# Patient Record
Sex: Female | Born: 1975
Health system: Southern US, Community
[De-identification: ages and names within clinical notes are randomized; demographics above are authoritative.]

## PROBLEM LIST (undated history)

## (undated) DIAGNOSIS — M545 Low back pain, unspecified: Secondary | ICD-10-CM

## (undated) DIAGNOSIS — D649 Anemia, unspecified: Secondary | ICD-10-CM

## (undated) DIAGNOSIS — F419 Anxiety disorder, unspecified: Secondary | ICD-10-CM

## (undated) DIAGNOSIS — Z8669 Personal history of other diseases of the nervous system and sense organs: Secondary | ICD-10-CM

## (undated) DIAGNOSIS — E063 Autoimmune thyroiditis: Secondary | ICD-10-CM

## (undated) DIAGNOSIS — N390 Urinary tract infection, site not specified: Secondary | ICD-10-CM

## (undated) DIAGNOSIS — R112 Nausea with vomiting, unspecified: Secondary | ICD-10-CM

## (undated) DIAGNOSIS — L710 Perioral dermatitis: Secondary | ICD-10-CM

## (undated) DIAGNOSIS — K219 Gastro-esophageal reflux disease without esophagitis: Secondary | ICD-10-CM

## (undated) HISTORY — PX: TUBAL LIGATION: SHX77

## (undated) HISTORY — DX: Anxiety disorder, unspecified: F41.9

## (undated) HISTORY — PX: CHOLECYSTECTOMY: SHX55

## (undated) HISTORY — DX: Perioral dermatitis: L71.0

## (undated) HISTORY — DX: Low back pain, unspecified: M54.50

## (undated) HISTORY — DX: Personal history of other diseases of the nervous system and sense organs: Z86.69

## (undated) HISTORY — DX: Gastro-esophageal reflux disease without esophagitis: K21.9

## (undated) HISTORY — DX: Autoimmune thyroiditis: E06.3

## (undated) HISTORY — PX: ARTHROSCOPIC REPAIR ACL: SUR80

## (undated) HISTORY — DX: Urinary tract infection, site not specified: N39.0

---
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
## 2012-11-09 ENCOUNTER — Ambulatory Visit: Payer: Self-pay | Admitting: Specialist

## 2016-03-23 ENCOUNTER — Ambulatory Visit
Admission: RE | Admit: 2016-03-23 | Discharge: 2016-03-23 | Disposition: A | Discharge status: Home / Self Care | Payer: BLUE CROSS/BLUE SHIELD | Source: Ambulatory Visit | Attending: Family Medicine | Admitting: Family Medicine

## 2016-03-23 ENCOUNTER — Other Ambulatory Visit: Payer: Self-pay | Admitting: Family Medicine

## 2016-03-23 DIAGNOSIS — R079 Chest pain, unspecified: Secondary | ICD-10-CM

## 2016-03-23 DIAGNOSIS — M438X4 Other specified deforming dorsopathies, thoracic region: Secondary | ICD-10-CM | POA: Insufficient documentation

## 2017-09-14 ENCOUNTER — Other Ambulatory Visit: Payer: Self-pay | Admitting: Family Medicine

## 2017-09-14 DIAGNOSIS — Z1231 Encounter for screening mammogram for malignant neoplasm of breast: Secondary | ICD-10-CM

## 2017-12-26 DIAGNOSIS — J029 Acute pharyngitis, unspecified: Secondary | ICD-10-CM | POA: Diagnosis not present

## 2017-12-26 DIAGNOSIS — R0989 Other specified symptoms and signs involving the circulatory and respiratory systems: Secondary | ICD-10-CM | POA: Diagnosis not present

## 2018-01-15 DIAGNOSIS — K219 Gastro-esophageal reflux disease without esophagitis: Secondary | ICD-10-CM | POA: Diagnosis not present

## 2018-01-15 DIAGNOSIS — D105 Benign neoplasm of other parts of oropharynx: Secondary | ICD-10-CM | POA: Diagnosis not present

## 2018-03-07 ENCOUNTER — Other Ambulatory Visit: Payer: Self-pay | Admitting: Registered Nurse

## 2018-03-07 DIAGNOSIS — Z1231 Encounter for screening mammogram for malignant neoplasm of breast: Secondary | ICD-10-CM

## 2018-03-19 ENCOUNTER — Ambulatory Visit
Admission: RE | Admit: 2018-03-19 | Discharge: 2018-03-19 | Disposition: A | Discharge status: Home / Self Care | Payer: BLUE CROSS/BLUE SHIELD | Source: Ambulatory Visit | Attending: Registered Nurse | Admitting: Registered Nurse

## 2018-03-19 DIAGNOSIS — Z1231 Encounter for screening mammogram for malignant neoplasm of breast: Secondary | ICD-10-CM | POA: Diagnosis not present

## 2018-03-29 ENCOUNTER — Other Ambulatory Visit: Payer: Self-pay | Admitting: Registered Nurse

## 2018-03-29 DIAGNOSIS — Z1231 Encounter for screening mammogram for malignant neoplasm of breast: Secondary | ICD-10-CM

## 2018-04-04 ENCOUNTER — Encounter: Payer: Self-pay | Admitting: Obstetrics and Gynecology

## 2018-04-04 ENCOUNTER — Ambulatory Visit (INDEPENDENT_AMBULATORY_CARE_PROVIDER_SITE_OTHER): Payer: BLUE CROSS/BLUE SHIELD | Admitting: Obstetrics and Gynecology

## 2018-04-04 ENCOUNTER — Other Ambulatory Visit (HOSPITAL_COMMUNITY)
Admission: RE | Admit: 2018-04-04 | Discharge: 2018-04-04 | Disposition: A | Discharge status: Home / Self Care | Payer: BLUE CROSS/BLUE SHIELD | Source: Ambulatory Visit | Attending: Obstetrics and Gynecology | Admitting: Obstetrics and Gynecology

## 2018-04-04 VITALS — BP 110/70 | HR 76 | Ht 66.0 in | Wt 148.0 lb

## 2018-04-04 DIAGNOSIS — Z124 Encounter for screening for malignant neoplasm of cervix: Secondary | ICD-10-CM

## 2018-04-04 DIAGNOSIS — Z1151 Encounter for screening for human papillomavirus (HPV): Secondary | ICD-10-CM | POA: Insufficient documentation

## 2018-04-04 DIAGNOSIS — Z01419 Encounter for gynecological examination (general) (routine) without abnormal findings: Secondary | ICD-10-CM | POA: Diagnosis not present

## 2018-04-04 DIAGNOSIS — Z1239 Encounter for other screening for malignant neoplasm of breast: Secondary | ICD-10-CM

## 2018-04-04 NOTE — Progress Notes (Signed)
PCP:  Patient, No Pcp Per   Chief Complaint  Patient presents with  . Gynecologic Exam     HPI:      [name] is a 42 y.o. [id number] who LMP was Patient's last menstrual period was 03/24/2018 (exact date)., presents today for her NP annual examination.  Her menses are regular every 28-30 days, lasting 5-6 days.  Dysmenorrhea mild, occurring first 1-2 days of flow. She does not have intermenstrual bleeding.  Sex activity: single partner, contraception - tubal ligation and vasectomy.  Last Pap: not recent; no hx of abn paps  Last mammogram: March 19, 2018  Results were: normal--routine follow-up in 12 months There is no FH of breast cancer. There is no FH of ovarian cancer. The patient does do self-breast exams.  Tobacco use: smokes 1 pack per wk; trying to quit; has done chantix 3 times and all the OTC nicotine products. Alcohol use: none No drug use.  Exercise: moderately active  She does get adequate calcium and Vitamin D in her diet. Labs with PCP  Past Medical History:  Diagnosis Date  . GERD (gastroesophageal reflux disease)   . Hashimoto's disease     Past Surgical History:  Procedure Laterality Date  . TUBAL LIGATION      Family History  Problem Relation Age of Onset  . Breast cancer Neg Hx   . Ovarian cancer Neg Hx     Social History   Socioeconomic History  . Marital status: Married    Spouse name: Not on file  . Number of children: Not on file  . Years of education: Not on file  . Highest education level: Not on file  Occupational History  . Not on file  Social Needs  . Financial resource strain: Not on file  . Food insecurity:    Worry: Not on file    Inability: Not on file  . Transportation needs:    Medical: Not on file    Non-medical: Not on file  Tobacco Use  . Smoking status: Current Every Day Smoker  . Smokeless tobacco: Never Used  Substance and Sexual Activity  . Alcohol use: Yes  . Drug use: Never  . Sexual  activity: Yes    Birth control/protection: Surgical    Comment: Tubal ligation/Vasectomy  Lifestyle  . Physical activity:    Days per week: Not on file    Minutes per session: Not on file  . Stress: Not on file  Relationships  . Social connections:    Talks on phone: Not on file    Gets together: Not on file    Attends religious service: Not on file    Active member of club or organization: Not on file    Attends meetings of clubs or organizations: Not on file    Relationship status: Not on file  . Intimate partner violence:    Fear of current or ex partner: Not on file    Emotionally abused: Not on file    Physically abused: Not on file    Forced sexual activity: Not on file  Other Topics Concern  . Not on file  Social History Narrative  . Not on file    Outpatient Medications Prior to Visit  Medication Sig Dispense Refill  . levothyroxine (SYNTHROID) 200 MCG tablet     . omeprazole (PRILOSEC) 40 MG capsule   2   No facility-administered medications prior to visit.      ROS:  Review of Systems  Constitutional: Negative for fatigue, fever and unexpected weight change.  Respiratory: Negative for cough, shortness of breath and wheezing.   Cardiovascular: Negative for chest pain, palpitations and leg swelling.  Gastrointestinal: Negative for blood in stool, constipation, diarrhea, nausea and vomiting.  Endocrine: Negative for cold intolerance, heat intolerance and polyuria.  Genitourinary: Negative for dyspareunia, dysuria, flank pain, frequency, genital sores, hematuria, menstrual problem, pelvic pain, urgency, vaginal bleeding, vaginal discharge and vaginal pain.  Musculoskeletal: Negative for back pain, joint swelling and myalgias.  Skin: Negative for rash.  Neurological: Negative for dizziness, syncope, light-headedness, numbness and headaches.  Hematological: Negative for adenopathy.  Psychiatric/Behavioral: Negative for agitation, confusion, sleep disturbance and  suicidal ideas. The patient is not nervous/anxious.   BREAST: No symptoms   Objective: BP 110/70   Pulse 76   Ht 5\' 6"  (1.676 m)   Wt 148 lb (67.1 kg)   LMP 03/24/2018 (Exact Date)   BMI 23.89 kg/m    Physical Exam  Constitutional: She is oriented to person, place, and time. She appears well-developed and well-nourished.  Genitourinary: Vagina normal and uterus normal. There is no rash or tenderness on the right labia. There is no rash or tenderness on the left labia. No erythema or tenderness in the vagina. No vaginal discharge found. Right adnexum does not display mass and does not display tenderness. Left adnexum does not display mass and does not display tenderness. Cervix does not exhibit motion tenderness or polyp. Uterus is not enlarged or tender.  Neck: Normal range of motion. No thyromegaly present.  Cardiovascular: Normal rate, regular rhythm and normal heart sounds.  No murmur heard. Pulmonary/Chest: Effort normal and breath sounds normal. Right breast exhibits no mass, no nipple discharge, no skin change and no tenderness. Left breast exhibits no mass, no nipple discharge, no skin change and no tenderness.  Abdominal: Soft. There is no tenderness. There is no guarding.  Musculoskeletal: Normal range of motion.  Neurological: She is alert and oriented to person, place, and time. No cranial nerve deficit.  Psychiatric: She has a normal mood and affect. Her behavior is normal.  Vitals reviewed.   Assessment/Plan: Encounter for annual routine gynecological examination  Cervical cancer screening - Plan: Cytology - PAP  Screening for HPV (human papillomavirus) - Plan: Cytology - PAP  Screening for breast cancer - Pt current on mammo           GYN counsel breast self exam, mammography screening, adequate intake of calcium and vitamin D, diet and exercise     F/U  Return in about 1 year (around 04/05/2019).  Felipe Cabell B. Edem Tiegs, PA-C 04/04/2018 10:36 AM

## 2018-04-04 NOTE — Patient Instructions (Signed)
I value your feedback and entrusting us with your care. If you get a [facility] patient survey, I would appreciate you taking the time to let us know about your experience today. Thank you! 

## 2018-04-06 LAB — CYTOLOGY - PAP
Diagnosis: NEGATIVE
HPV (WINDOPATH): NOT DETECTED

## 2018-12-13 DIAGNOSIS — Z3042 Encounter for surveillance of injectable contraceptive: Secondary | ICD-10-CM | POA: Diagnosis not present

## 2019-02-03 ENCOUNTER — Telehealth: Payer: BLUE CROSS/BLUE SHIELD | Admitting: Nurse Practitioner

## 2019-02-03 DIAGNOSIS — L700 Acne vulgaris: Secondary | ICD-10-CM

## 2019-02-03 MED ORDER — MINOCYCLINE HCL 50 MG PO TABS: 50.0000 mg | ORAL_TABLET | Freq: Two times a day (BID) | ORAL | 0 refills | Status: DC

## 2019-02-03 NOTE — Progress Notes (Signed)
We are sorry that you are experiencing this issue.  Here is how we plan to help!  Based on what you shared with me it looks like you have cystic acne.  Acne is a disorder of the hair follicles and oil glands (sebaceous glands). The sebaceous glands secrete oils to keep the skin moist.  When the glands get clogged, it can lead to pimples or cysts.  These cysts may become infected and leave scars. Acne is very common and normally occurs at puberty.  Acne is also inherited.  Your personal care plan consists of the following recommendations:  I recommend that you use a daily cleanser  You might try an over the counter cleanser that has benzoyl peroxide.  I recommend that you start with a product that has 2.5% benzoyl peroxide.  Stronger concentrations have not been shown to be more effective.   I have also prescribed one of the following additional therapies:  Minocycline an oral antibiotic 50 mg twice a day  If excessive dryness or peeling occurs, reduce dose frequency or concentration of the topical scrubs.  If excessive stinging or burning occurs, remove the topical gel with mild soap and water and resume at a lower dose the next day.  Remember oral antibiotics and topical acne treatments may increase your sensitivity to the sun!  HOME CARE:  Do not squeeze pimples because that can often lead to infections, worse acne, and scars.  Use a moisturizer that contains retinoid or fruit acids that may inhibit the development of new acne lesions.  Although there is not a clear link that foods can cause acne, doctors do believe that too many sweets predispose you to skin problems.  GET HELP RIGHT AWAY IF:  If your acne gets worse or is not better within 10 days.  If you become depressed.  If you become pregnant, discontinue medications and call your OB/GYN.  MAKE SURE YOU:  Understand these instructions.  Will watch your condition.  Will get help right away if you are not doing well or  get worse.   Your e-visit answers were reviewed by a board certified advanced clinical practitioner to complete your personal care plan.  Depending upon the condition, your plan could have included both over the counter or prescription medications.  Please review your pharmacy choice.  If there is a problem, you may contact your provider through [employer] and have the prescription routed to another pharmacy.  Your safety is important to [place].  If you have drug allergies check your prescription carefully.  For the next 24 hours you can use MyChart to ask questions about today's visit, request a non-urgent call back, or ask for a work or school excuse from your e-visit provider.  You will get an email in the next two days asking about your experience. I hope that your e-visit has been valuable and will speed your recovery.  5-10 minutes spent reviewing and documenting in chart.

## 2019-02-27 ENCOUNTER — Telehealth: Payer: Self-pay | Admitting: Registered Nurse

## 2019-02-27 ENCOUNTER — Encounter: Payer: Self-pay | Admitting: Registered Nurse

## 2019-02-27 DIAGNOSIS — Z1231 Encounter for screening mammogram for malignant neoplasm of breast: Secondary | ICD-10-CM

## 2019-02-27 NOTE — Telephone Encounter (Signed)
Patient due for screening mammogram 03/21/2019 or later.  ACR Breast Density Category c: The breast tissue is heterogeneously dense, which may obscure small masses  Birads 1 screening mammogram recommended in 1 year on 03/19/2018 mammogram.  Patient contacted via telephone, she has not yet scheduled and needs new order to schedule this year.  Would like to go to same facility [facility]. Notified patient I would enter electronic order for her and she should call to schedule.  Patient verbalized understanding information/instructions, agreed with plan of care and had no further questions at this time.

## 2019-03-07 DIAGNOSIS — Z3042 Encounter for surveillance of injectable contraceptive: Secondary | ICD-10-CM | POA: Diagnosis not present

## 2019-03-13 ENCOUNTER — Other Ambulatory Visit: Payer: Self-pay | Admitting: Registered Nurse

## 2019-03-13 DIAGNOSIS — Z1231 Encounter for screening mammogram for malignant neoplasm of breast: Secondary | ICD-10-CM

## 2019-03-28 ENCOUNTER — Other Ambulatory Visit: Payer: Self-pay

## 2019-03-28 ENCOUNTER — Ambulatory Visit
Admission: RE | Admit: 2019-03-28 | Discharge: 2019-03-28 | Disposition: A | Discharge status: Home / Self Care | Payer: 59 | Source: Ambulatory Visit | Attending: Registered Nurse | Admitting: Registered Nurse

## 2019-03-28 DIAGNOSIS — Z1231 Encounter for screening mammogram for malignant neoplasm of breast: Secondary | ICD-10-CM | POA: Diagnosis not present

## 2019-03-29 ENCOUNTER — Other Ambulatory Visit: Payer: Self-pay | Admitting: Registered Nurse

## 2019-03-29 DIAGNOSIS — N6489 Other specified disorders of breast: Secondary | ICD-10-CM

## 2019-03-29 DIAGNOSIS — R928 Other abnormal and inconclusive findings on diagnostic imaging of breast: Secondary | ICD-10-CM

## 2019-04-02 NOTE — Progress Notes (Signed)
noted 

## 2019-04-10 ENCOUNTER — Ambulatory Visit
Admission: RE | Admit: 2019-04-10 | Discharge: 2019-04-10 | Disposition: A | Discharge status: Home / Self Care | Payer: 59 | Source: Ambulatory Visit | Attending: Registered Nurse | Admitting: Registered Nurse

## 2019-04-10 DIAGNOSIS — R928 Other abnormal and inconclusive findings on diagnostic imaging of breast: Secondary | ICD-10-CM

## 2019-04-10 DIAGNOSIS — N6489 Other specified disorders of breast: Secondary | ICD-10-CM | POA: Diagnosis not present

## 2019-04-10 DIAGNOSIS — N6321 Unspecified lump in the left breast, upper outer quadrant: Secondary | ICD-10-CM | POA: Diagnosis not present

## 2019-04-10 DIAGNOSIS — N6323 Unspecified lump in the left breast, lower outer quadrant: Secondary | ICD-10-CM | POA: Diagnosis not present

## 2019-04-10 DIAGNOSIS — R922 Inconclusive mammogram: Secondary | ICD-10-CM | POA: Diagnosis not present

## 2019-04-11 ENCOUNTER — Telehealth: Payer: Self-pay | Admitting: Registered Nurse

## 2019-04-11 ENCOUNTER — Other Ambulatory Visit: Payer: Self-pay | Admitting: Registered Nurse

## 2019-04-11 ENCOUNTER — Encounter: Payer: Self-pay | Admitting: Registered Nurse

## 2019-04-11 DIAGNOSIS — N6320 Unspecified lump in the left breast, unspecified quadrant: Secondary | ICD-10-CM

## 2019-04-11 NOTE — Telephone Encounter (Signed)
See result note.  Orders placed for 6 month interval follow up recommended by radiologist and discussed with patient today via telephone Birads 3 probable benign mass left breast.

## 2019-04-12 ENCOUNTER — Ambulatory Visit: Payer: 59

## 2019-04-12 ENCOUNTER — Other Ambulatory Visit: Payer: 59

## 2019-05-28 DIAGNOSIS — D225 Melanocytic nevi of trunk: Secondary | ICD-10-CM | POA: Diagnosis not present

## 2019-05-28 DIAGNOSIS — D485 Neoplasm of uncertain behavior of skin: Secondary | ICD-10-CM | POA: Diagnosis not present

## 2019-05-28 DIAGNOSIS — X32XXXA Exposure to sunlight, initial encounter: Secondary | ICD-10-CM | POA: Diagnosis not present

## 2019-05-28 DIAGNOSIS — L570 Actinic keratosis: Secondary | ICD-10-CM | POA: Diagnosis not present

## 2019-09-24 NOTE — Progress Notes (Signed)
Scheduled to complete physical 10/02/19 with Durward Parcel, PA-C.  Repeated EKG changing leads & placement on limbs & still same results.  AMD

## 2019-09-25 ENCOUNTER — Ambulatory Visit: Payer: Self-pay

## 2019-09-25 ENCOUNTER — Other Ambulatory Visit: Payer: Self-pay

## 2019-09-25 DIAGNOSIS — Z0000 Encounter for general adult medical examination without abnormal findings: Secondary | ICD-10-CM

## 2019-09-25 LAB — POCT URINALYSIS DIPSTICK
Bilirubin, UA: NEGATIVE
Glucose, UA: NEGATIVE
Ketones, UA: NEGATIVE
Leukocytes, UA: NEGATIVE
Nitrite, UA: NEGATIVE
Protein, UA: NEGATIVE
Spec Grav, UA: 1.02 (ref 1.010–1.025)
Urobilinogen, UA: 0.2 E.U./dL
pH, UA: 6 (ref 5.0–8.0)

## 2019-09-26 LAB — CMP12+LP+TP+TSH+6AC+CBC/D/PLT
ALT: 8 IU/L (ref 0–32)
AST: 18 IU/L (ref 0–40)
Albumin/Globulin Ratio: 1.7 (ref 1.2–2.2)
Albumin: 4.5 g/dL (ref 3.8–4.8)
Alkaline Phosphatase: 72 IU/L (ref 39–117)
BUN/Creatinine Ratio: 14 (ref 9–23)
BUN: 10 mg/dL (ref 6–24)
Basophils Absolute: 0 10*3/uL (ref 0.0–0.2)
Basos: 1 %
Bilirubin Total: 0.5 mg/dL (ref 0.0–1.2)
Calcium: 9.3 mg/dL (ref 8.7–10.2)
Chloride: 106 mmol/L (ref 96–106)
Chol/HDL Ratio: 2.6 ratio (ref 0.0–4.4)
Cholesterol, Total: 129 mg/dL (ref 100–199)
Creatinine, Ser: 0.71 mg/dL (ref 0.57–1.00)
EOS (ABSOLUTE): 0.2 10*3/uL (ref 0.0–0.4)
Eos: 2 %
Estimated CHD Risk: <0.5 times avg. (ref 0.0–1.0)
Free Thyroxine Index: 4 (ref 1.2–4.9)
GFR calc Af Amer: 119 mL/min/{1.73_m2} (ref >59)
GFR calc non Af Amer: 103 mL/min/{1.73_m2} (ref >59)
GGT: 13 IU/L (ref 0–60)
Globulin, Total: 2.6 g/dL (ref 1.5–4.5)
Glucose: 86 mg/dL (ref 65–99)
HDL: 50 mg/dL (ref >39)
Hematocrit: 37.6 % (ref 34.0–46.6)
Hemoglobin: 12.9 g/dL (ref 11.1–15.9)
Immature Grans (Abs): 0 10*3/uL (ref 0.0–0.1)
Immature Granulocytes: 0 %
Iron: 89 ug/dL (ref 27–159)
LDH: 166 IU/L (ref 119–226)
LDL Chol Calc (NIH): 68 mg/dL (ref 0–99)
Lymphocytes Absolute: 1.2 10*3/uL (ref 0.7–3.1)
Lymphs: 18 %
MCH: 30.6 pg (ref 26.6–33.0)
MCHC: 34.3 g/dL (ref 31.5–35.7)
MCV: 89 fL (ref 79–97)
Monocytes Absolute: 0.4 10*3/uL (ref 0.1–0.9)
Monocytes: 6 %
Neutrophils Absolute: 4.9 10*3/uL (ref 1.4–7.0)
Neutrophils: 73 %
Phosphorus: 3.2 mg/dL (ref 3.0–4.3)
Platelets: 213 10*3/uL (ref 150–450)
Potassium: 4.3 mmol/L (ref 3.5–5.2)
RBC: 4.22 x10E6/uL (ref 3.77–5.28)
RDW: 12.5 % (ref 11.7–15.4)
Sodium: 139 mmol/L (ref 134–144)
T3 Uptake Ratio: 33 % (ref 24–39)
T4, Total: 12.2 ug/dL — ABNORMAL HIGH (ref 4.5–12.0)
TSH: 0.278 u[IU]/mL — ABNORMAL LOW (ref 0.450–4.500)
Total Protein: 7.1 g/dL (ref 6.0–8.5)
Triglycerides: 46 mg/dL (ref 0–149)
Uric Acid: 3.3 mg/dL (ref 2.6–6.2)
VLDL Cholesterol Cal: 11 mg/dL (ref 5–40)
WBC: 6.8 10*3/uL (ref 3.4–10.8)

## 2019-10-02 ENCOUNTER — Ambulatory Visit: Payer: Self-pay | Admitting: Emergency Medicine

## 2019-10-02 ENCOUNTER — Encounter: Payer: Self-pay | Admitting: Emergency Medicine

## 2019-10-02 ENCOUNTER — Other Ambulatory Visit: Payer: Self-pay

## 2019-10-02 VITALS — BP 121/80 | HR 72 | Temp 98.8°F | Resp 12 | Ht 66.0 in | Wt 147.0 lb

## 2019-10-02 DIAGNOSIS — N390 Urinary tract infection, site not specified: Secondary | ICD-10-CM

## 2019-10-02 DIAGNOSIS — Z0000 Encounter for general adult medical examination without abnormal findings: Secondary | ICD-10-CM

## 2019-10-02 NOTE — Progress Notes (Signed)
   Subjective: Annual screening exam    Patient ID: Pamela Cabrera, female    DOB: 07-03-1975, 44 y.o.   MRN: [id number]  HPI Patient presents for follow-up today and to review her lab work that was done previously.  She has no complaints, is requesting refill of Macrobid.   Review of Systems Unremarkable    Objective:   Physical Exam  Gen: Alert, no acute distress Eyes: Pupils equal react light HEENT: Normal Cardiovascular: Regular rate and rhythm, normal S1-S2 Pulmonary: Lungs clear to auscultation bilaterally Abdomen: Soft nontender, no hepatosplenomegaly, normal bowel sounds Extremities: Normal range of motion, no edema Neurological exam: Cranial nerves II through XII grossly intact, normal gait        Assessment & Plan: Annual physical exam  Patient looks well, no acute distress.  She is requesting a refill of Macrobid which I will provide for her.  Her thyroid tests are slightly abnormal, TSH slightly elevated and TSH is low.  We will need to decrease her Synthroid to 88 mcg daily and discontinue the 100 mcg tablet.  Follow-up in 2 months for repeat thyroid testing

## 2019-10-02 NOTE — Progress Notes (Signed)
Labs & EKG completed 09/25/19.  AMD

## 2019-11-06 ENCOUNTER — Other Ambulatory Visit: Payer: Self-pay

## 2019-11-06 DIAGNOSIS — E039 Hypothyroidism, unspecified: Secondary | ICD-10-CM

## 2019-11-06 MED ORDER — LEVOTHYROXINE SODIUM 88 MCG PO TABS: 88.0000 ug | ORAL_TABLET | Freq: Every day | ORAL | 1 refills | Status: DC

## 2019-12-05 ENCOUNTER — Other Ambulatory Visit: Payer: Self-pay

## 2019-12-05 DIAGNOSIS — E039 Hypothyroidism, unspecified: Secondary | ICD-10-CM

## 2019-12-05 NOTE — Progress Notes (Signed)
Follow up requested by Albina Billet

## 2019-12-06 LAB — THYROID PANEL WITH TSH
Free Thyroxine Index: 1.7 (ref 1.2–4.9)
T3 Uptake Ratio: 24 % (ref 24–39)
T4, Total: 7 ug/dL (ref 4.5–12.0)
TSH: 18.8 u[IU]/mL — ABNORMAL HIGH (ref 0.450–4.500)

## 2019-12-12 ENCOUNTER — Other Ambulatory Visit: Payer: Self-pay

## 2019-12-12 DIAGNOSIS — E039 Hypothyroidism, unspecified: Secondary | ICD-10-CM

## 2019-12-13 ENCOUNTER — Telehealth: Payer: Self-pay

## 2019-12-13 LAB — THYROID PANEL WITH TSH
Free Thyroxine Index: 2.5 (ref 1.2–4.9)
T3 Uptake Ratio: 28 % (ref 24–39)
T4, Total: 8.9 ug/dL (ref 4.5–12.0)
TSH: 4.49 u[IU]/mL (ref 0.450–4.500)

## 2019-12-13 LAB — T4, FREE: Free T4: 1.5 ng/dL (ref 0.82–1.77)

## 2019-12-13 NOTE — Telephone Encounter (Signed)
error 

## 2019-12-13 NOTE — Progress Notes (Signed)
am

## 2019-12-13 NOTE — Addendum Note (Signed)
Addended by: Gardner Candle on: 12/13/2019 04:52 PM   Modules accepted: Orders

## 2019-12-18 ENCOUNTER — Other Ambulatory Visit: Payer: Self-pay

## 2019-12-18 DIAGNOSIS — N6320 Unspecified lump in the left breast, unspecified quadrant: Secondary | ICD-10-CM

## 2019-12-18 DIAGNOSIS — R922 Inconclusive mammogram: Secondary | ICD-10-CM | POA: Diagnosis not present

## 2020-01-24 DIAGNOSIS — F411 Generalized anxiety disorder: Secondary | ICD-10-CM | POA: Insufficient documentation

## 2020-01-24 DIAGNOSIS — M545 Low back pain, unspecified: Secondary | ICD-10-CM | POA: Insufficient documentation

## 2020-01-24 DIAGNOSIS — G43909 Migraine, unspecified, not intractable, without status migrainosus: Secondary | ICD-10-CM | POA: Insufficient documentation

## 2020-01-24 DIAGNOSIS — E039 Hypothyroidism, unspecified: Secondary | ICD-10-CM | POA: Insufficient documentation

## 2020-01-24 DIAGNOSIS — L710 Perioral dermatitis: Secondary | ICD-10-CM | POA: Insufficient documentation

## 2020-02-06 DIAGNOSIS — E039 Hypothyroidism, unspecified: Secondary | ICD-10-CM | POA: Diagnosis not present

## 2020-02-19 ENCOUNTER — Other Ambulatory Visit: Payer: Self-pay

## 2020-02-19 DIAGNOSIS — Z1152 Encounter for screening for COVID-19: Secondary | ICD-10-CM

## 2020-02-19 NOTE — Progress Notes (Signed)
Presents to [facility] for outdoor specimen collection for covid testing.  Asymptomatic  Known exposure  Vaccinated  Has Mychart  AMD

## 2020-02-22 LAB — SARS-COV-2, NAA 2 DAY TAT

## 2020-02-22 LAB — NOVEL CORONAVIRUS, NAA: SARS-CoV-2, NAA: NOT DETECTED

## 2020-03-06 ENCOUNTER — Telehealth: Payer: Self-pay

## 2020-03-06 NOTE — Telephone Encounter (Signed)
Marcelino Duster called [facility] requesting Rx for Macrobid.  Checked paper chart in clinic & a copy of the Rx written by Dr. Daryel November on 10/02/19 was in the chart - written as follows: Macrobid 100 mg 1 tab po bid Qty #60  12 (Twelve) Refills  Called [facility] at [phone] & spoke with pharmacist Revonda Standard) who said Marcelino Duster never brought her written Rx into them. Because I have a copy of the Rx, she was able to take the Rx information verbally from me.  AMD

## 2020-03-10 DIAGNOSIS — D2271 Melanocytic nevi of right lower limb, including hip: Secondary | ICD-10-CM | POA: Diagnosis not present

## 2020-03-10 DIAGNOSIS — D2261 Melanocytic nevi of right upper limb, including shoulder: Secondary | ICD-10-CM | POA: Diagnosis not present

## 2020-03-10 DIAGNOSIS — D2262 Melanocytic nevi of left upper limb, including shoulder: Secondary | ICD-10-CM | POA: Diagnosis not present

## 2020-03-10 DIAGNOSIS — D2272 Melanocytic nevi of left lower limb, including hip: Secondary | ICD-10-CM | POA: Diagnosis not present

## 2020-03-10 DIAGNOSIS — D225 Melanocytic nevi of trunk: Secondary | ICD-10-CM | POA: Diagnosis not present

## 2020-05-05 DIAGNOSIS — R635 Abnormal weight gain: Secondary | ICD-10-CM | POA: Diagnosis not present

## 2020-05-05 DIAGNOSIS — E039 Hypothyroidism, unspecified: Secondary | ICD-10-CM | POA: Diagnosis not present

## 2020-05-05 DIAGNOSIS — Z720 Tobacco use: Secondary | ICD-10-CM | POA: Diagnosis not present

## 2020-05-14 DIAGNOSIS — E039 Hypothyroidism, unspecified: Secondary | ICD-10-CM | POA: Diagnosis not present

## 2020-05-21 DIAGNOSIS — E039 Hypothyroidism, unspecified: Secondary | ICD-10-CM | POA: Diagnosis not present

## 2020-08-20 DIAGNOSIS — E039 Hypothyroidism, unspecified: Secondary | ICD-10-CM | POA: Diagnosis not present

## 2020-09-02 DIAGNOSIS — Z1331 Encounter for screening for depression: Secondary | ICD-10-CM | POA: Diagnosis not present

## 2020-09-02 DIAGNOSIS — Z720 Tobacco use: Secondary | ICD-10-CM | POA: Diagnosis not present

## 2020-09-02 DIAGNOSIS — Z0000 Encounter for general adult medical examination without abnormal findings: Secondary | ICD-10-CM | POA: Diagnosis not present

## 2020-09-02 DIAGNOSIS — E039 Hypothyroidism, unspecified: Secondary | ICD-10-CM | POA: Diagnosis not present

## 2020-09-09 DIAGNOSIS — Z0000 Encounter for general adult medical examination without abnormal findings: Secondary | ICD-10-CM | POA: Diagnosis not present

## 2020-09-09 DIAGNOSIS — E039 Hypothyroidism, unspecified: Secondary | ICD-10-CM | POA: Diagnosis not present

## 2020-10-14 DIAGNOSIS — H5213 Myopia, bilateral: Secondary | ICD-10-CM | POA: Diagnosis not present

## 2020-10-14 DIAGNOSIS — Z0100 Encounter for examination of eyes and vision without abnormal findings: Secondary | ICD-10-CM | POA: Diagnosis not present

## 2020-11-24 DIAGNOSIS — E039 Hypothyroidism, unspecified: Secondary | ICD-10-CM | POA: Diagnosis not present

## 2020-12-02 DIAGNOSIS — E039 Hypothyroidism, unspecified: Secondary | ICD-10-CM | POA: Diagnosis not present

## 2021-01-29 ENCOUNTER — Telehealth: Payer: Self-pay | Admitting: Registered Nurse

## 2021-01-29 NOTE — Telephone Encounter (Signed)
Patient overdue 6 month follow up mammogram Birads 3 probably benign and ultrasound  Orders noted by RN Stone Jul 2021 but no results in Epic.    Telephone message left for patient to contact clinic/provider at [phone] verify if follow up imaging completed.  CLINICAL DATA:  Patient was called back from screening mammogram for possible asymmetries in the left breast.   EXAM: DIGITAL DIAGNOSTIC LEFT MAMMOGRAM WITH CAD AND TOMO   ULTRASOUND LEFT BREAST   COMPARISON:  Previous exam(s).   ACR Breast Density Category c: The breast tissue is heterogeneously dense, which may obscure small masses.   FINDINGS: Additional imaging of the left breast was performed. The asymmetries in the medial and lateral aspect of the breast are less conspicuous on the additional imaging. They are is no suspicious mass or malignant type microcalcifications.   Mammographic images were processed with CAD.   On physical exam, I do not palpate a mass in the left breast.   Targeted ultrasound is performed, showing a well-circumscribed hypoechoic mass in the left breast at 3 o'clock 3 cm from the nipple with increased through transmission measuring 6 x 4 x 8 mm. No additional mass is identified in the lateral aspect of the left breast. No suspicious mass, distortion or abnormal shadowing detected in the medial aspect of the left breast.   IMPRESSION: Probable benign asymmetries and probable benign mass in the left breast.   RECOMMENDATION: Short-term interval follow-up left mammogram and ultrasound 6 months is recommended.   I have discussed the findings and recommendations with the patient. If applicable, a reminder letter will be sent to the patient regarding the next appointment.   BI-RADS CATEGORY  3: Probably benign.     Electronically Signed   By: Baird Lyons M.D.   On: 04/10/2019 14:44

## 2021-03-02 DIAGNOSIS — E039 Hypothyroidism, unspecified: Secondary | ICD-10-CM | POA: Diagnosis not present

## 2021-03-12 DIAGNOSIS — D2262 Melanocytic nevi of left upper limb, including shoulder: Secondary | ICD-10-CM | POA: Diagnosis not present

## 2021-03-12 DIAGNOSIS — D225 Melanocytic nevi of trunk: Secondary | ICD-10-CM | POA: Diagnosis not present

## 2021-03-12 DIAGNOSIS — D2272 Melanocytic nevi of left lower limb, including hip: Secondary | ICD-10-CM | POA: Diagnosis not present

## 2021-03-12 DIAGNOSIS — L821 Other seborrheic keratosis: Secondary | ICD-10-CM | POA: Diagnosis not present

## 2021-03-12 DIAGNOSIS — D2271 Melanocytic nevi of right lower limb, including hip: Secondary | ICD-10-CM | POA: Diagnosis not present

## 2021-03-12 DIAGNOSIS — L814 Other melanin hyperpigmentation: Secondary | ICD-10-CM | POA: Diagnosis not present

## 2021-03-12 DIAGNOSIS — X32XXXA Exposure to sunlight, initial encounter: Secondary | ICD-10-CM | POA: Diagnosis not present

## 2021-03-12 DIAGNOSIS — D2261 Melanocytic nevi of right upper limb, including shoulder: Secondary | ICD-10-CM | POA: Diagnosis not present

## 2021-03-16 NOTE — Telephone Encounter (Signed)
Spoke with RN Paschal Dopp today and patient no longer getting PCM care through [facility].  She will need to follow up with her PCM/GYN provider for follow up mammogram order/completion.  Notified RN Paschal Dopp I would contact patient with reminder.  Message reminder due for mammogram follow up.  Will need order from her PCM since no longer utilizing [facility] for primary care.  Contact RN Paschal Dopp or myself at [phone] if further questions.  Notified patient my number does not have voicemail and I will be unavailable tomorrow afternoon due to meetings.

## 2021-06-04 DIAGNOSIS — E039 Hypothyroidism, unspecified: Secondary | ICD-10-CM | POA: Diagnosis not present

## 2021-06-08 DIAGNOSIS — E039 Hypothyroidism, unspecified: Secondary | ICD-10-CM | POA: Diagnosis not present

## 2021-08-23 DIAGNOSIS — L700 Acne vulgaris: Secondary | ICD-10-CM | POA: Diagnosis not present

## 2021-10-01 DIAGNOSIS — Z136 Encounter for screening for cardiovascular disorders: Secondary | ICD-10-CM | POA: Diagnosis not present

## 2021-10-01 DIAGNOSIS — Z0000 Encounter for general adult medical examination without abnormal findings: Secondary | ICD-10-CM | POA: Diagnosis not present

## 2021-10-01 DIAGNOSIS — Z131 Encounter for screening for diabetes mellitus: Secondary | ICD-10-CM | POA: Diagnosis not present

## 2021-10-01 DIAGNOSIS — Z1329 Encounter for screening for other suspected endocrine disorder: Secondary | ICD-10-CM | POA: Diagnosis not present

## 2021-10-01 DIAGNOSIS — E039 Hypothyroidism, unspecified: Secondary | ICD-10-CM | POA: Diagnosis not present

## 2021-10-01 DIAGNOSIS — Z720 Tobacco use: Secondary | ICD-10-CM | POA: Diagnosis not present

## 2021-10-05 DIAGNOSIS — Z0000 Encounter for general adult medical examination without abnormal findings: Secondary | ICD-10-CM | POA: Diagnosis not present

## 2021-10-05 DIAGNOSIS — E039 Hypothyroidism, unspecified: Secondary | ICD-10-CM | POA: Diagnosis not present

## 2021-10-05 DIAGNOSIS — Z1331 Encounter for screening for depression: Secondary | ICD-10-CM | POA: Diagnosis not present

## 2021-10-05 DIAGNOSIS — R635 Abnormal weight gain: Secondary | ICD-10-CM | POA: Diagnosis not present

## 2021-10-18 ENCOUNTER — Other Ambulatory Visit: Payer: Self-pay | Admitting: Internal Medicine

## 2021-10-18 ENCOUNTER — Inpatient Hospital Stay
Admission: RE | Admit: 2021-10-18 | Discharge: 2021-10-18 | Disposition: A | Discharge status: Home / Self Care | Payer: Self-pay | Source: Ambulatory Visit | Attending: *Deleted | Admitting: *Deleted

## 2021-10-18 ENCOUNTER — Other Ambulatory Visit: Payer: Self-pay | Admitting: *Deleted

## 2021-10-18 DIAGNOSIS — Z1231 Encounter for screening mammogram for malignant neoplasm of breast: Secondary | ICD-10-CM

## 2021-10-18 DIAGNOSIS — N6489 Other specified disorders of breast: Secondary | ICD-10-CM

## 2021-10-18 DIAGNOSIS — N63 Unspecified lump in unspecified breast: Secondary | ICD-10-CM

## 2021-11-16 DIAGNOSIS — R58 Hemorrhage, not elsewhere classified: Secondary | ICD-10-CM | POA: Diagnosis not present

## 2021-11-16 DIAGNOSIS — D485 Neoplasm of uncertain behavior of skin: Secondary | ICD-10-CM | POA: Diagnosis not present

## 2021-11-16 DIAGNOSIS — L980 Pyogenic granuloma: Secondary | ICD-10-CM | POA: Diagnosis not present

## 2021-11-17 ENCOUNTER — Ambulatory Visit
Admission: RE | Admit: 2021-11-17 | Discharge: 2021-11-17 | Disposition: A | Discharge status: Home / Self Care | Payer: 59 | Source: Ambulatory Visit | Attending: Internal Medicine | Admitting: Internal Medicine

## 2021-11-17 DIAGNOSIS — Z1231 Encounter for screening mammogram for malignant neoplasm of breast: Secondary | ICD-10-CM | POA: Insufficient documentation

## 2022-02-09 DIAGNOSIS — E039 Hypothyroidism, unspecified: Secondary | ICD-10-CM | POA: Diagnosis not present

## 2022-02-16 DIAGNOSIS — E039 Hypothyroidism, unspecified: Secondary | ICD-10-CM | POA: Diagnosis not present

## 2022-03-14 DIAGNOSIS — D2262 Melanocytic nevi of left upper limb, including shoulder: Secondary | ICD-10-CM | POA: Diagnosis not present

## 2022-03-14 DIAGNOSIS — D2271 Melanocytic nevi of right lower limb, including hip: Secondary | ICD-10-CM | POA: Diagnosis not present

## 2022-03-14 DIAGNOSIS — D225 Melanocytic nevi of trunk: Secondary | ICD-10-CM | POA: Diagnosis not present

## 2022-03-14 DIAGNOSIS — L821 Other seborrheic keratosis: Secondary | ICD-10-CM | POA: Diagnosis not present

## 2022-03-14 DIAGNOSIS — D2272 Melanocytic nevi of left lower limb, including hip: Secondary | ICD-10-CM | POA: Diagnosis not present

## 2022-03-14 DIAGNOSIS — D2261 Melanocytic nevi of right upper limb, including shoulder: Secondary | ICD-10-CM | POA: Diagnosis not present

## 2022-05-12 ENCOUNTER — Emergency Department
Admission: EM | Admit: 2022-05-12 | Discharge: 2022-05-12 | Discharge status: Against Medical Advice | Payer: 59 | Source: Home / Self Care

## 2022-10-10 DIAGNOSIS — R635 Abnormal weight gain: Secondary | ICD-10-CM | POA: Diagnosis not present

## 2022-10-10 DIAGNOSIS — D649 Anemia, unspecified: Secondary | ICD-10-CM | POA: Diagnosis not present

## 2022-10-10 DIAGNOSIS — Z0000 Encounter for general adult medical examination without abnormal findings: Secondary | ICD-10-CM | POA: Diagnosis not present

## 2022-10-10 DIAGNOSIS — E039 Hypothyroidism, unspecified: Secondary | ICD-10-CM | POA: Diagnosis not present

## 2022-10-18 DIAGNOSIS — Z1211 Encounter for screening for malignant neoplasm of colon: Secondary | ICD-10-CM | POA: Diagnosis not present

## 2022-10-18 DIAGNOSIS — E039 Hypothyroidism, unspecified: Secondary | ICD-10-CM | POA: Diagnosis not present

## 2022-10-18 DIAGNOSIS — Z1212 Encounter for screening for malignant neoplasm of rectum: Secondary | ICD-10-CM | POA: Diagnosis not present

## 2022-10-18 DIAGNOSIS — Z1331 Encounter for screening for depression: Secondary | ICD-10-CM | POA: Diagnosis not present

## 2022-10-18 DIAGNOSIS — Z0000 Encounter for general adult medical examination without abnormal findings: Secondary | ICD-10-CM | POA: Diagnosis not present

## 2022-10-18 DIAGNOSIS — D649 Anemia, unspecified: Secondary | ICD-10-CM | POA: Diagnosis not present

## 2022-10-18 DIAGNOSIS — Z6825 Body mass index (BMI) 25.0-25.9, adult: Secondary | ICD-10-CM | POA: Diagnosis not present

## 2022-10-18 DIAGNOSIS — R790 Abnormal level of blood mineral: Secondary | ICD-10-CM | POA: Diagnosis not present

## 2022-10-18 DIAGNOSIS — R7989 Other specified abnormal findings of blood chemistry: Secondary | ICD-10-CM | POA: Diagnosis not present

## 2022-10-19 ENCOUNTER — Other Ambulatory Visit: Payer: Self-pay | Admitting: Internal Medicine

## 2022-10-19 DIAGNOSIS — Z1231 Encounter for screening mammogram for malignant neoplasm of breast: Secondary | ICD-10-CM

## 2022-10-25 DIAGNOSIS — D649 Anemia, unspecified: Secondary | ICD-10-CM | POA: Diagnosis not present

## 2022-10-25 DIAGNOSIS — R790 Abnormal level of blood mineral: Secondary | ICD-10-CM | POA: Diagnosis not present

## 2022-10-26 DIAGNOSIS — Z1212 Encounter for screening for malignant neoplasm of rectum: Secondary | ICD-10-CM | POA: Diagnosis not present

## 2022-10-26 DIAGNOSIS — Z1211 Encounter for screening for malignant neoplasm of colon: Secondary | ICD-10-CM | POA: Diagnosis not present

## 2022-11-04 LAB — COLOGUARD: COLOGUARD: NEGATIVE

## 2022-11-22 ENCOUNTER — Ambulatory Visit
Admission: RE | Admit: 2022-11-22 | Discharge: 2022-11-22 | Disposition: A | Discharge status: Home / Self Care | Payer: 59 | Source: Ambulatory Visit | Attending: Internal Medicine | Admitting: Internal Medicine

## 2022-11-22 DIAGNOSIS — Z1231 Encounter for screening mammogram for malignant neoplasm of breast: Secondary | ICD-10-CM | POA: Diagnosis not present

## 2022-12-06 DIAGNOSIS — H524 Presbyopia: Secondary | ICD-10-CM | POA: Diagnosis not present

## 2022-12-13 ENCOUNTER — Other Ambulatory Visit (HOSPITAL_COMMUNITY)
Admission: RE | Admit: 2022-12-13 | Discharge: 2022-12-13 | Disposition: A | Discharge status: Home / Self Care | Payer: 59 | Source: Ambulatory Visit | Attending: Obstetrics and Gynecology | Admitting: Obstetrics and Gynecology

## 2022-12-13 ENCOUNTER — Ambulatory Visit (INDEPENDENT_AMBULATORY_CARE_PROVIDER_SITE_OTHER): Payer: 59 | Admitting: Obstetrics and Gynecology

## 2022-12-13 ENCOUNTER — Encounter: Payer: Self-pay | Admitting: Obstetrics and Gynecology

## 2022-12-13 VITALS — BP 102/70 | Ht 66.0 in | Wt 158.0 lb

## 2022-12-13 DIAGNOSIS — Z01419 Encounter for gynecological examination (general) (routine) without abnormal findings: Secondary | ICD-10-CM | POA: Diagnosis not present

## 2022-12-13 DIAGNOSIS — Z1151 Encounter for screening for human papillomavirus (HPV): Secondary | ICD-10-CM | POA: Diagnosis not present

## 2022-12-13 DIAGNOSIS — N951 Menopausal and female climacteric states: Secondary | ICD-10-CM

## 2022-12-13 DIAGNOSIS — Z1231 Encounter for screening mammogram for malignant neoplasm of breast: Secondary | ICD-10-CM

## 2022-12-13 DIAGNOSIS — Z124 Encounter for screening for malignant neoplasm of cervix: Secondary | ICD-10-CM | POA: Diagnosis not present

## 2022-12-13 NOTE — Progress Notes (Signed)
PCP:  Barbette Reichmann, MD   Chief Complaint  Patient presents with   Gynecologic Exam    Hot flashes, sweats, acne, irritated x 6 months     HPI:      Pamela Cabrera is a 47 y.o. [phone] who LMP was Patient's last menstrual period was 11/22/2022 (exact date)., presents today for her NP > 3 yrs annual examination.  Her menses are regular every 28-30 days, lasting 5 days, 1 heavy day.  Dysmenorrhea mild, improved with NSAIDs. She does not have intermenstrual bleeding. Has vasomtoor sx that are tolerable, hasn't tried herbal supp.  Hx of hypothyroidism with elevated TSH on 5/24 labs; sees endocrine.   Sex activity: single partner, contraception - tubal ligation and vasectomy. No pain/bleeding/dryness. Last Pap: 04/04/18 Results were no abnormalities/neg HPV DNA; no hx of abn paps  Last mammogram: 11/22/22 Results were: normal--routine follow-up in 12 months There is no FH of breast cancer. There is no FH of ovarian cancer. The patient does do self-breast exams.  Tobacco use: quit 2 yrs ago Alcohol use: none No drug use.  Exercise: moderately active  Colonoscopy: never; neg Cologuard 5/24; repeat due after 3 yrs  She does get adequate calcium and Vitamin D in her diet. Labs with PCP  Past Medical History:  Diagnosis Date   Anxiety    GERD (gastroesophageal reflux disease)    Hashimoto's disease    History of migraine headaches    Lumbar pain    Perioral dermatitis    UTI (urinary tract infection)     Past Surgical History:  Procedure Laterality Date   ARTHROSCOPIC REPAIR ACL Right    CHOLECYSTECTOMY     TUBAL LIGATION      Family History  Problem Relation Age of Onset   Breast cancer Neg Hx    Ovarian cancer Neg Hx     Social History   Socioeconomic History   Marital status: Married    Spouse name: Not on file   Number of children: Not on file   Years of education: Not on file   Highest education level: Not on file  Occupational History   Not  on file  Tobacco Use   Smoking status: Every Day   Smokeless tobacco: Never  Vaping Use   Vaping Use: Never used  Substance and Sexual Activity   Alcohol use: Yes   Drug use: Never   Sexual activity: Yes    Birth control/protection: Surgical    Comment: Tubal ligation/Vasectomy  Other Topics Concern   Not on file  Social History Narrative   Not on file   Social Determinants of Health   Financial Resource Strain: Not on file  Food Insecurity: Not on file  Transportation Needs: Not on file  Physical Activity: Not on file  Stress: Not on file  Social Connections: Not on file  Intimate Partner Violence: Not on file    Outpatient Medications Prior to Visit  Medication Sig Dispense Refill   folic acid (FOLVITE) 1 MG tablet Take by mouth.     iron polysaccharides (NIFEREX) 150 MG capsule Take by mouth.     levothyroxine (SYNTHROID) 100 MCG tablet Take 100 mcg by mouth every morning.     nitrofurantoin, macrocrystal-monohydrate, (MACROBID) 100 MG capsule Take 100 mg by mouth 2 (two) times daily.     triamcinolone cream (KENALOG) 0.5 % Apply topically.     Vitamin D, Ergocalciferol, 50000 units CAPS Take 1 capsule by mouth once a week.  levothyroxine (SYNTHROID) 112 MCG tablet Take by mouth. (Patient not taking: Reported on 12/13/2022)     levothyroxine (SYNTHROID) 88 MCG tablet Take 1 tablet (88 mcg total) by mouth daily. (Patient not taking: Reported on 12/13/2022) 90 tablet 1   minocycline (DYNACIN) 50 MG tablet Take 1 tablet (50 mg total) by mouth 2 (two) times daily. 20 tablet 0   omeprazole (PRILOSEC) 40 MG capsule   2   No facility-administered medications prior to visit.     ROS:  Review of Systems  Constitutional:  Negative for fatigue, fever and unexpected weight change.  Respiratory:  Negative for cough, shortness of breath and wheezing.   Cardiovascular:  Negative for chest pain, palpitations and leg swelling.  Gastrointestinal:  Negative for blood in stool,  constipation, diarrhea, nausea and vomiting.  Endocrine: Negative for cold intolerance, heat intolerance and polyuria.  Genitourinary:  Negative for dyspareunia, dysuria, flank pain, frequency, genital sores, hematuria, menstrual problem, pelvic pain, urgency, vaginal bleeding, vaginal discharge and vaginal pain.  Musculoskeletal:  Negative for back pain, joint swelling and myalgias.  Skin:  Negative for rash.  Neurological:  Negative for dizziness, syncope, light-headedness, numbness and headaches.  Hematological:  Negative for adenopathy.  Psychiatric/Behavioral:  Negative for agitation, confusion, sleep disturbance and suicidal ideas. The patient is not nervous/anxious.   BREAST: No symptoms   Objective: BP 102/70   Ht 5\' 6"  (1.676 m)   Wt 158 lb (71.7 kg)   LMP 11/22/2022 (Exact Date)   BMI 25.50 kg/m    Physical Exam Constitutional:      Appearance: She is well-developed.  Genitourinary:     Vulva normal.     Right Labia: No rash, tenderness or lesions.    Left Labia: No tenderness, lesions or rash.    No vaginal discharge, erythema or tenderness.      Right Adnexa: not tender and no mass present.    Left Adnexa: not tender and no mass present.    No cervical motion tenderness, friability or polyp.     Uterus is not enlarged or tender.  Breasts:    Right: No mass, nipple discharge, skin change or tenderness.     Left: No mass, nipple discharge, skin change or tenderness.  Neck:     Thyroid: No thyromegaly.  Cardiovascular:     Rate and Rhythm: Normal rate and regular rhythm.     Heart sounds: Normal heart sounds. No murmur heard. Pulmonary:     Effort: Pulmonary effort is normal.     Breath sounds: Normal breath sounds.  Abdominal:     Palpations: Abdomen is soft.     Tenderness: There is no abdominal tenderness. There is no guarding or rebound.  Musculoskeletal:        General: Normal range of motion.     Cervical back: Normal range of motion.  Lymphadenopathy:      Cervical: No cervical adenopathy.  Neurological:     General: No focal deficit present.     Mental Status: She is alert and oriented to person, place, and time.     Cranial Nerves: No cranial nerve deficit.  Skin:    General: Skin is warm and dry.  Psychiatric:        Mood and Affect: Mood normal.        Behavior: Behavior normal.        Thought Content: Thought content normal.        Judgment: Judgment normal.  Vitals reviewed.     Assessment/Plan:  Encounter for annual routine gynecological examination  Cervical cancer screening - Plan: Cytology - PAP  Screening for HPV (human papillomavirus) - Plan: Cytology - PAP  Encounter for screening mammogram for malignant neoplasm of breast; pt current on mammo  Perimenopausal vasomotor symptoms--can try estroven as needed. Pt not interested in tx currently. F/u prn/prn AUB.            GYN counsel breast self exam, mammography screening, adequate intake of calcium and vitamin D, diet and exercise     F/U  Return in about 1 year (around 12/13/2023).  Stashia Sia B. Joy Haegele, PA-C 12/13/2022 4:53 PM

## 2022-12-13 NOTE — Patient Instructions (Signed)
I value your feedback and you entrusting us with your care. If you get a [facility] patient survey, I would appreciate you taking the time to let us know about your experience today. Thank you! ? ? ?

## 2022-12-16 ENCOUNTER — Encounter: Payer: Self-pay | Admitting: Obstetrics and Gynecology

## 2022-12-19 LAB — CYTOLOGY - PAP
Comment: NEGATIVE
Diagnosis: NEGATIVE
High risk HPV: NEGATIVE

## 2022-12-21 DIAGNOSIS — H16142 Punctate keratitis, left eye: Secondary | ICD-10-CM | POA: Diagnosis not present

## 2022-12-29 NOTE — Progress Notes (Signed)
noted 

## 2023-01-23 ENCOUNTER — Encounter: Payer: Self-pay | Admitting: Obstetrics and Gynecology

## 2023-01-23 ENCOUNTER — Ambulatory Visit: Payer: 59 | Admitting: Obstetrics and Gynecology

## 2023-01-23 VITALS — BP 112/90 | HR 81 | Resp 16 | Ht 66.0 in | Wt 157.3 lb

## 2023-01-23 DIAGNOSIS — R3129 Other microscopic hematuria: Secondary | ICD-10-CM | POA: Diagnosis not present

## 2023-01-23 DIAGNOSIS — R399 Unspecified symptoms and signs involving the genitourinary system: Secondary | ICD-10-CM

## 2023-01-23 DIAGNOSIS — R3989 Other symptoms and signs involving the genitourinary system: Secondary | ICD-10-CM

## 2023-01-23 LAB — POCT URINALYSIS DIPSTICK
Bilirubin, UA: NEGATIVE
Glucose, UA: NEGATIVE
Ketones, UA: NEGATIVE
Nitrite, UA: NEGATIVE
Protein, UA: NEGATIVE
Spec Grav, UA: 1.015 (ref 1.010–1.025)
pH, UA: 5 (ref 5.0–8.0)

## 2023-01-23 MED ORDER — CEPHALEXIN 500 MG PO CAPS
500.0000 mg | ORAL_CAPSULE | Freq: Two times a day (BID) | ORAL | 0 refills | Status: AC
Start: 2023-01-23 — End: 2023-01-30

## 2023-01-23 NOTE — Progress Notes (Signed)
Barbette Reichmann, MD   Chief Complaint  Patient presents with   Dysuria    HPI:      [name] is a 47 y.o. [id number] whose LMP was Patient's last menstrual period was 01/15/2023., presents today for pelvic/bladder pain for 2 wks. No frequency/urgency/hematuria/dysuria. No LBP, fevers, vag sx, or vaginal bleeding. Pt with hx of recurrent UTI since age 33 (not sexually active). Had eval at [facility] at the time that was negative. Pt put on preventive macrobid. Takes now postcoitally or when feels sx about to start. Usually 1 dose works fine. Sx this time started about 2 wks ago after sex. Has been taking macrobid every day to every other day since without sx change. Drinking lots of water, cut out tea. No hx of kidney stones; no change with cranberry juice/D mannose in past. I can see prior UA in Epic from [facility] but not C&S results. Looks like trace/small hematuria frequently on UA (hard to see if screening or with sx).  She is sexually active, no pain. Menses are still monthly.    Patient Active Problem List   Diagnosis Date Noted   Hypothyroidism 01/24/2020   Lumbar pain 01/24/2020   Migraines 01/24/2020   Perioral dermatitis 01/24/2020   anxiety 01/24/2020   Recurrent UTI 10/04/1985    Past Surgical History:  Procedure Laterality Date   ARTHROSCOPIC REPAIR ACL Right    CHOLECYSTECTOMY     TUBAL LIGATION      Family History  Problem Relation Age of Onset   Breast cancer Neg Hx    Ovarian cancer Neg Hx     Social History   Socioeconomic History   Marital status: Married    Spouse name: Not on file   Number of children: Not on file   Years of education: Not on file   Highest education level: Not on file  Occupational History   Not on file  Tobacco Use   Smoking status: Every Day   Smokeless tobacco: Never  Vaping Use   Vaping status: Never Used  Substance and Sexual Activity   Alcohol use: Yes   Drug use: Never   Sexual activity: Yes     Birth control/protection: Surgical    Comment: Tubal ligation/Vasectomy  Other Topics Concern   Not on file  Social History Narrative   Not on file   Social Determinants of Health   Financial Resource Strain: Not on file  Food Insecurity: Not on file  Transportation Needs: Not on file  Physical Activity: Not on file  Stress: Not on file  Social Connections: Not on file  Intimate Partner Violence: Not on file    Outpatient Medications Prior to Visit  Medication Sig Dispense Refill   folic acid (FOLVITE) 1 MG tablet Take by mouth.     iron polysaccharides (NIFEREX) 150 MG capsule Take by mouth.     levothyroxine (SYNTHROID) 112 MCG tablet Take by mouth.     nitrofurantoin, macrocrystal-monohydrate, (MACROBID) 100 MG capsule Take 100 mg by mouth 2 (two) times daily.     Vitamin D, Ergocalciferol, 50000 units CAPS Take 1 capsule by mouth once a week.     levothyroxine (SYNTHROID) 100 MCG tablet Take 100 mcg by mouth every morning. (Patient not taking: Reported on 01/23/2023)     levothyroxine (SYNTHROID) 88 MCG tablet Take 1 tablet (88 mcg total) by mouth daily. (Patient not taking: Reported on 12/13/2022) 90 tablet 1   triamcinolone cream (KENALOG) 0.5 % Apply  topically. (Patient not taking: Reported on 01/23/2023)     No facility-administered medications prior to visit.      ROS:  Review of Systems  Constitutional:  Negative for fever.  Gastrointestinal:  Negative for blood in stool, constipation, diarrhea, nausea and vomiting.  Genitourinary:  Positive for pelvic pain. Negative for dyspareunia, dysuria, flank pain, frequency, hematuria, urgency, vaginal bleeding, vaginal discharge and vaginal pain.  Musculoskeletal:  Negative for back pain.  Skin:  Negative for rash.   BREAST: No symptoms   OBJECTIVE:   Vitals:  BP (!) 112/90   Pulse 81   Resp 16   Ht 5\' 6"  (1.676 m)   Wt 157 lb 4.8 oz (71.4 kg)   LMP 01/15/2023   BMI 25.39 kg/m   Physical Exam Vitals reviewed.   Constitutional:      Appearance: She is well-developed.  Pulmonary:     Effort: Pulmonary effort is normal.  Musculoskeletal:        General: Normal range of motion.     Cervical back: Normal range of motion.  Skin:    General: Skin is warm and dry.  Neurological:     General: No focal deficit present.     Mental Status: She is alert and oriented to person, place, and time.     Cranial Nerves: No cranial nerve deficit.  Psychiatric:        Mood and Affect: Mood normal.        Behavior: Behavior normal.        Thought Content: Thought content normal.        Judgment: Judgment normal.     Results: Results for orders placed or performed in visit on 01/23/23 (from the past 24 hour(s))  POCT urinalysis dipstick     Status: Abnormal   Collection Time: 01/23/23  2:54 PM  Result Value Ref Range   Color, UA yellow    Clarity, UA cloudy    Glucose, UA Negative Negative   Bilirubin, UA neg    Ketones, UA neg    Spec Grav, UA 1.015 1.010 - 1.025   Blood, UA mod    pH, UA 5.0 5.0 - 8.0   Protein, UA Negative Negative   Urobilinogen, UA     Nitrite, UA neg    Leukocytes, UA Trace (A) Negative   Appearance     Odor       Assessment/Plan: UTI symptoms - Plan: cephALEXin (KEFLEX) 500 MG capsule, Urine Culture, POCT urinalysis dipstick; pos sx and UA. With hematuria. Rx keflex since has sulfa allergy. Will f/u with results. If neg, will eval further.   Meds ordered this encounter  Medications   cephALEXin (KEFLEX) 500 MG capsule    Sig: Take 1 capsule (500 mg total) by mouth 2 (two) times daily for 7 days.    Dispense:  14 capsule    Refill:  0    Order Specific Question:   Supervising Provider    Answer:   Hildred Laser [[id number]]      Return if symptoms worsen or fail to improve.  Krystin Keeven B. Osmani Kersten, PA-C 01/23/2023 2:56 PM

## 2023-01-23 NOTE — Telephone Encounter (Signed)
Can you schedule pt? 

## 2023-01-25 LAB — URINE CULTURE: Organism ID, Bacteria: NO GROWTH

## 2023-01-26 NOTE — Addendum Note (Signed)
Addended by: Rica Records on: 01/26/2023 09:58 AM   Modules accepted: Orders

## 2023-02-01 ENCOUNTER — Ambulatory Visit (INDEPENDENT_AMBULATORY_CARE_PROVIDER_SITE_OTHER): Payer: 59

## 2023-02-01 DIAGNOSIS — R3129 Other microscopic hematuria: Secondary | ICD-10-CM | POA: Diagnosis not present

## 2023-02-01 LAB — POCT URINALYSIS DIPSTICK
Bilirubin, UA: NEGATIVE
Blood, UA: POSITIVE
Glucose, UA: NEGATIVE
Ketones, UA: NEGATIVE
Leukocytes, UA: NEGATIVE
Nitrite, UA: NEGATIVE
Protein, UA: NEGATIVE
Spec Grav, UA: 1.015 (ref 1.010–1.025)
Urobilinogen, UA: 0.2 E.U./dL
pH, UA: 7 (ref 5.0–8.0)

## 2023-02-01 NOTE — Progress Notes (Signed)
    NURSE VISIT NOTE  Subjective:    Patient ID: Tynishia Avara, female    DOB: 03/17/1976, 47 y.o.   MRN: [id number]       HPI  Patient is a 47 y.o. [id number] female who presents for on going pain with urination. Helmut Muster has been following this patient with this issue. Urinalysis is being sent out and waiting for results. Objective:    BP 114/80   Pulse 83   Wt 158 lb (71.7 kg)   LMP 01/15/2023   BMI 25.50 kg/m    Lab Review  Results for orders placed or performed in visit on 02/01/23  POCT Urinalysis Dipstick  Result Value Ref Range   Color, UA     Clarity, UA     Glucose, UA Negative Negative   Bilirubin, UA Negative    Ketones, UA Negative    Spec Grav, UA 1.015 1.010 - 1.025   Blood, UA Positive    pH, UA 7.0 5.0 - 8.0   Protein, UA Negative Negative   Urobilinogen, UA 0.2 0.2 or 1.0 E.U./dL   Nitrite, UA Negative    Leukocytes, UA Negative Negative   Appearance     Odor      Assessment:   1. Other microscopic hematuria      Plan:     Burtis Junes, CMA

## 2023-02-02 LAB — URINALYSIS, ROUTINE W REFLEX MICROSCOPIC
Bilirubin, UA: NEGATIVE
Glucose, UA: NEGATIVE
Ketones, UA: NEGATIVE
Leukocytes,UA: NEGATIVE
Nitrite, UA: NEGATIVE
Protein,UA: NEGATIVE
Specific Gravity, UA: 1.017 (ref 1.005–1.030)
Urobilinogen, Ur: 0.2 mg/dL (ref 0.2–1.0)
pH, UA: 5.5 (ref 5.0–7.5)

## 2023-02-02 LAB — MICROSCOPIC EXAMINATION
Bacteria, UA: NONE SEEN
Casts: NONE SEEN /LPF

## 2023-03-06 ENCOUNTER — Ambulatory Visit (INDEPENDENT_AMBULATORY_CARE_PROVIDER_SITE_OTHER): Payer: 59 | Admitting: Urology

## 2023-03-06 ENCOUNTER — Encounter: Payer: Self-pay | Admitting: Urology

## 2023-03-06 VITALS — BP 127/83 | HR 91 | Ht 66.0 in | Wt 158.0 lb

## 2023-03-06 DIAGNOSIS — R3129 Other microscopic hematuria: Secondary | ICD-10-CM

## 2023-03-06 DIAGNOSIS — R319 Hematuria, unspecified: Secondary | ICD-10-CM

## 2023-03-06 DIAGNOSIS — R102 Pelvic and perineal pain: Secondary | ICD-10-CM | POA: Diagnosis not present

## 2023-03-06 NOTE — Progress Notes (Signed)
I, Maysun Anabel Bene, acting as a scribe for Riki Altes, MD., have documented all relevant documentation on the behalf of Riki Altes, MD, as directed by Riki Altes, MD while in the presence of Riki Altes, MD.  03/06/2023 3:04 PM   Joelly Calton Golds 1975-07-09 [id number]  Referring provider: Rica Records, PA-C [address] [place],  [place] [address]  Chief Complaint  Patient presents with   New Patient (Initial Visit)   Hematuria    HPI: Pamela Cabrera is a 47 y.o. female referred for microhematuria.   Seen by gynecology 01/23/23 with a 2 week history of pelvic pain without lower urinary tract symptoms, dysuria. She had no gross hematuria.  Long history of recurrent UTI since age 39. She had a urologic evaluation at that time which showed no anatomic abnormalities and was put on macrobid prophylaxis. She presently takes post-coitally or when she feels her UTI symptoms are about to occur. She's had several urinalysis which have shown dipstick hematuria, but no significant microscopic hematuria until the follow-up UA 02/01/23 which showed 3-10 RBCs per high-power field.   PMH: Past Medical History:  Diagnosis Date   Anxiety    GERD (gastroesophageal reflux disease)    Hashimoto's disease    History of migraine headaches    Lumbar pain    Perioral dermatitis    UTI (urinary tract infection)     Surgical History: Past Surgical History:  Procedure Laterality Date   ARTHROSCOPIC REPAIR ACL Right    CHOLECYSTECTOMY     TUBAL LIGATION      Home Medications:  Allergies as of 03/06/2023       Reactions   Sulfa Antibiotics Anaphylaxis, Hives, Itching        Medication List        Accurate as of March 06, 2023  3:04 PM. If you have any questions, ask your nurse or doctor.          STOP taking these medications    nitrofurantoin (macrocrystal-monohydrate) 100 MG capsule Commonly known as: MACROBID Stopped by: Riki Altes       TAKE these medications    folic acid 1 MG tablet Commonly known as: FOLVITE Take by mouth.   iron polysaccharides 150 MG capsule Commonly known as: NIFEREX Take by mouth.   levothyroxine 112 MCG tablet Commonly known as: SYNTHROID Take by mouth.   Vitamin D (Ergocalciferol) 50000 units Caps Take 1 capsule by mouth once a week.        Allergies:  Allergies  Allergen Reactions   Sulfa Antibiotics Anaphylaxis, Hives and Itching    Family History: Family History  Problem Relation Age of Onset   Breast cancer Neg Hx    Ovarian cancer Neg Hx     Social History:  reports that she has been smoking. She has never used smokeless tobacco. She reports current alcohol use. She reports that she does not use drugs.   Physical Exam: BP 127/83   Pulse 91   Ht 5\' 6"  (1.676 m)   Wt 158 lb (71.7 kg)   BMI 25.50 kg/m   Constitutional:  Alert and oriented, No acute distress. HEENT: [place] AT, moist mucus membranes.  Trachea midline, no masses. Cardiovascular: No clubbing, cyanosis, or edema. Respiratory: Normal respiratory effort, no increased work of breathing. GI: Abdomen is soft, nontender, nondistended, no abdominal masses Skin: No rashes, bruises or suspicious lesions. Neurologic: Grossly intact, no focal deficits, moving all 4 extremities.  Psychiatric: Normal mood and affect.   Urinalysis 1+ blood on dipstick, microscopy negative.    Assessment & Plan:    1. Microhematuria She has had 1 UA showing clinically significant microhematuria at 3-10 RBCs. We discussed dipstick positive hematuria without >3 RBCs per high-power field is not a reliable indicator of hematuria. Since she does have pelvic pain, recommend further evaluation with a renal stone protocol CT.  If CT negative and pelvic symptoms persist, would recommend cystoscopy under anesthesia with hydrodistention.  [facility] [address] [place], [place]  [address] [phone]

## 2023-03-07 ENCOUNTER — Encounter: Payer: Self-pay | Admitting: Urology

## 2023-03-07 LAB — MICROSCOPIC EXAMINATION: Bacteria, UA: NONE SEEN

## 2023-03-07 LAB — URINALYSIS, COMPLETE
Bilirubin, UA: NEGATIVE
Glucose, UA: NEGATIVE
Ketones, UA: NEGATIVE
Leukocytes,UA: NEGATIVE
Nitrite, UA: NEGATIVE
Protein,UA: NEGATIVE
Specific Gravity, UA: 1.01 (ref 1.005–1.030)
Urobilinogen, Ur: 0.2 mg/dL (ref 0.2–1.0)
pH, UA: 5.5 (ref 5.0–7.5)

## 2023-03-14 ENCOUNTER — Ambulatory Visit
Admission: RE | Admit: 2023-03-14 | Discharge: 2023-03-14 | Disposition: A | Discharge status: Home / Self Care | Payer: 59 | Source: Ambulatory Visit | Attending: Urology | Admitting: Urology

## 2023-03-14 DIAGNOSIS — R319 Hematuria, unspecified: Secondary | ICD-10-CM | POA: Diagnosis not present

## 2023-03-14 DIAGNOSIS — R3129 Other microscopic hematuria: Secondary | ICD-10-CM | POA: Diagnosis not present

## 2023-03-15 IMAGING — MG MM DIGITAL SCREENING BILAT W/ TOMO AND CAD
6 of 10 series · 6 of 30 positions shown · non-contrast
Comparison: Previous exam(s).

CLINICAL DATA: Screening.

EXAM:
DIGITAL SCREENING BILATERAL MAMMOGRAM WITH TOMOSYNTHESIS AND CAD
TECHNIQUE: Bilateral screening digital craniocaudal and mediolateral oblique
mammograms were obtained. Bilateral screening digital breast
tomosynthesis was performed. The images were evaluated with
computer-aided detection.

[R CC synth-2D (1 of 2)]
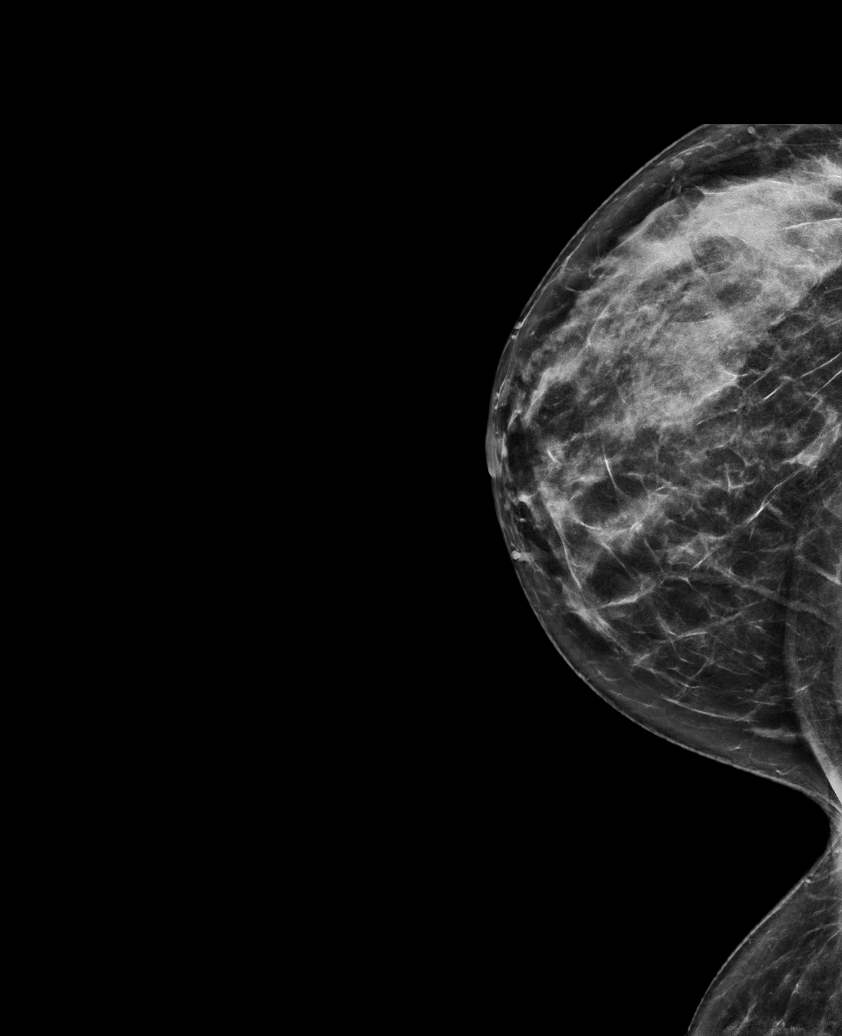

[R MLO synth-2D]
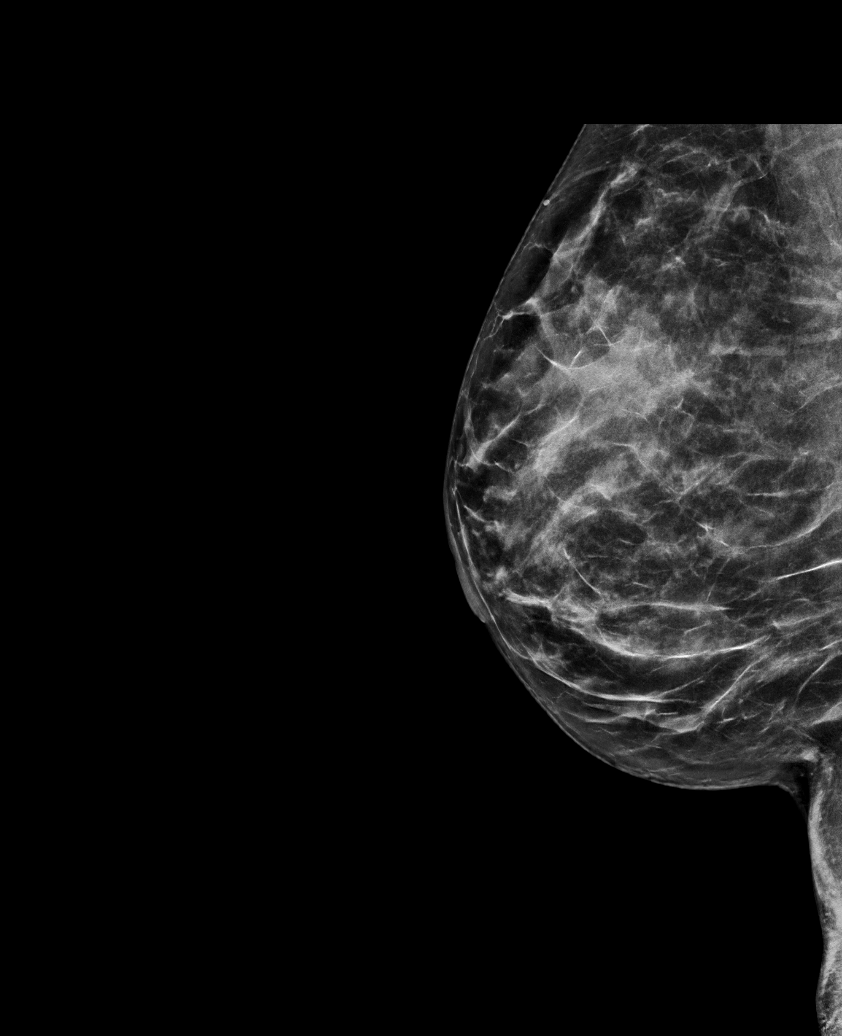

[L MLO synth-2D]
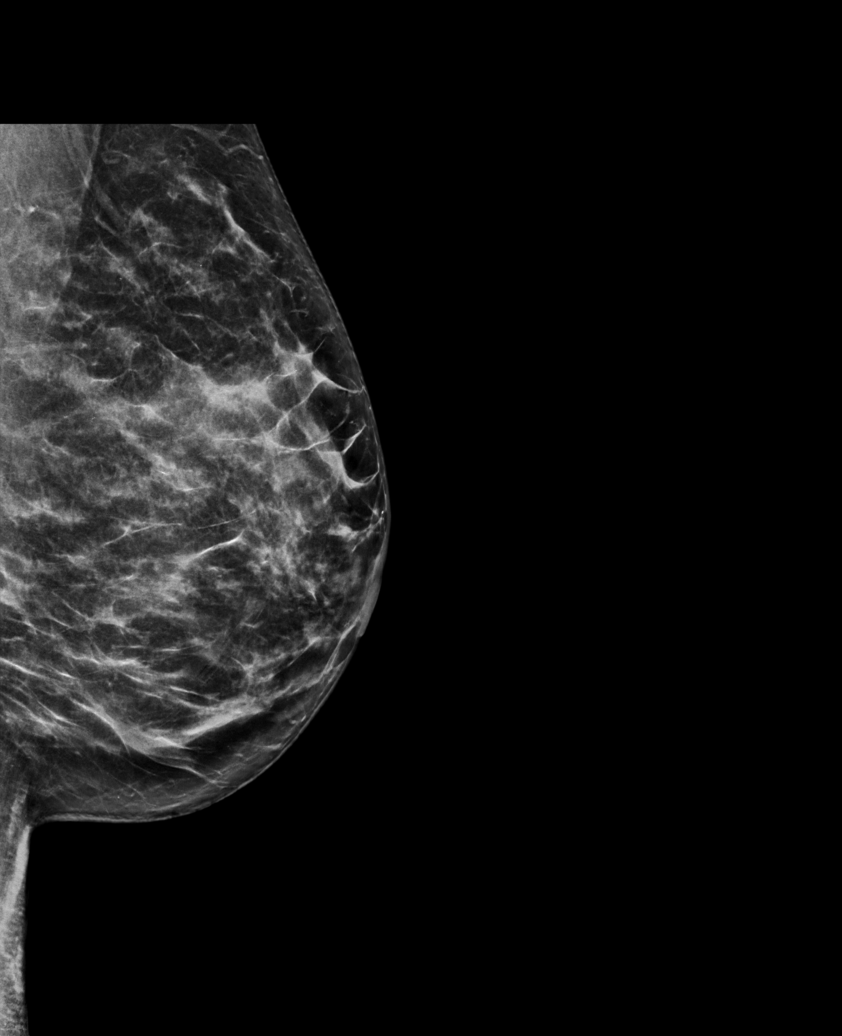

[R CC synth-2D (2 of 2)]
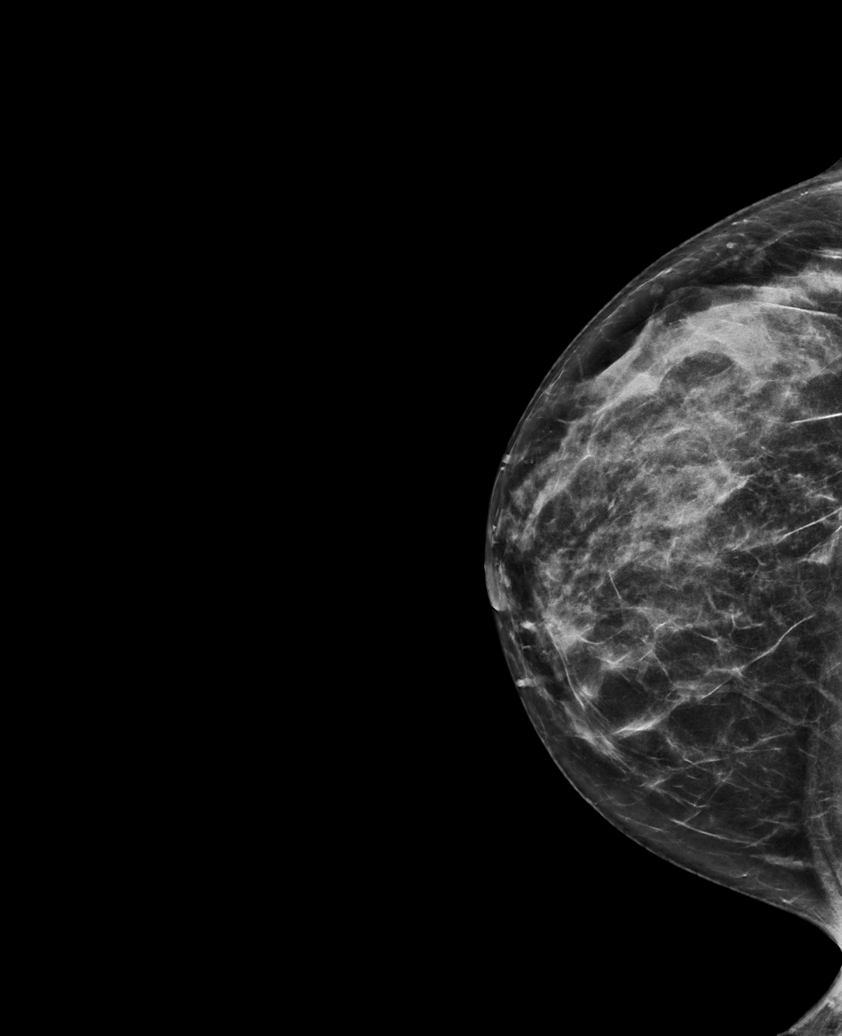

[L CC synth-2D]
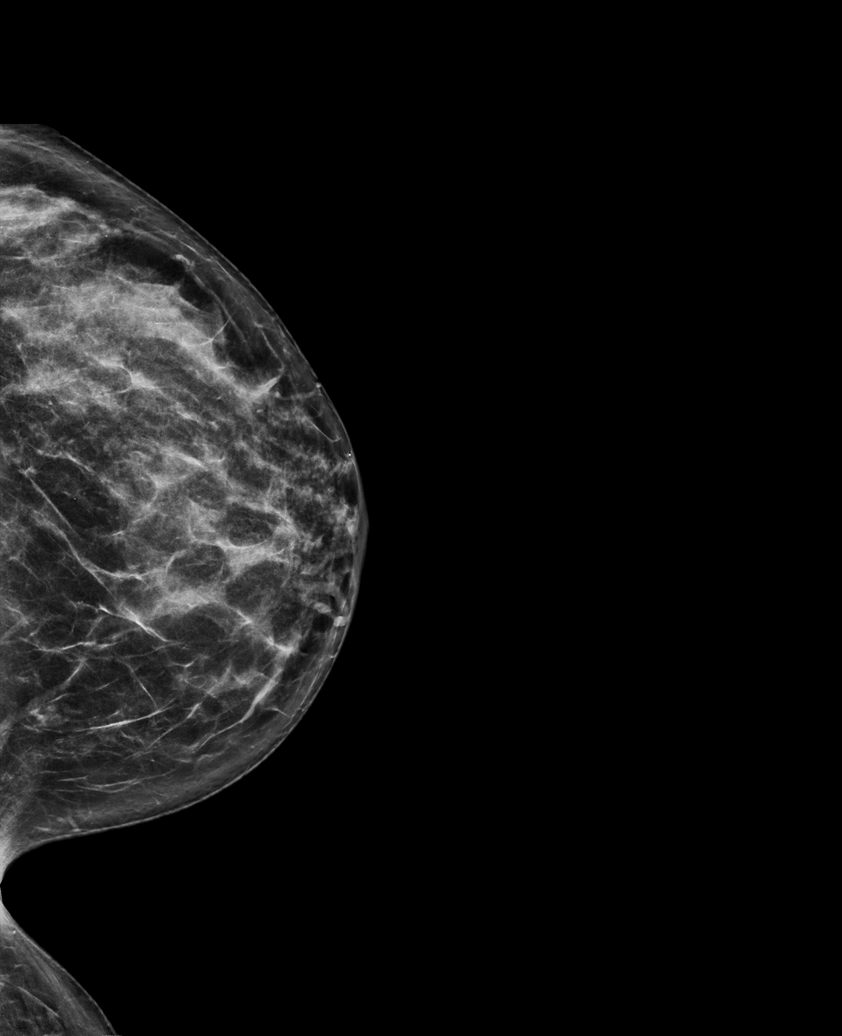

[L CC tomo · tomo slice 36/71.0]
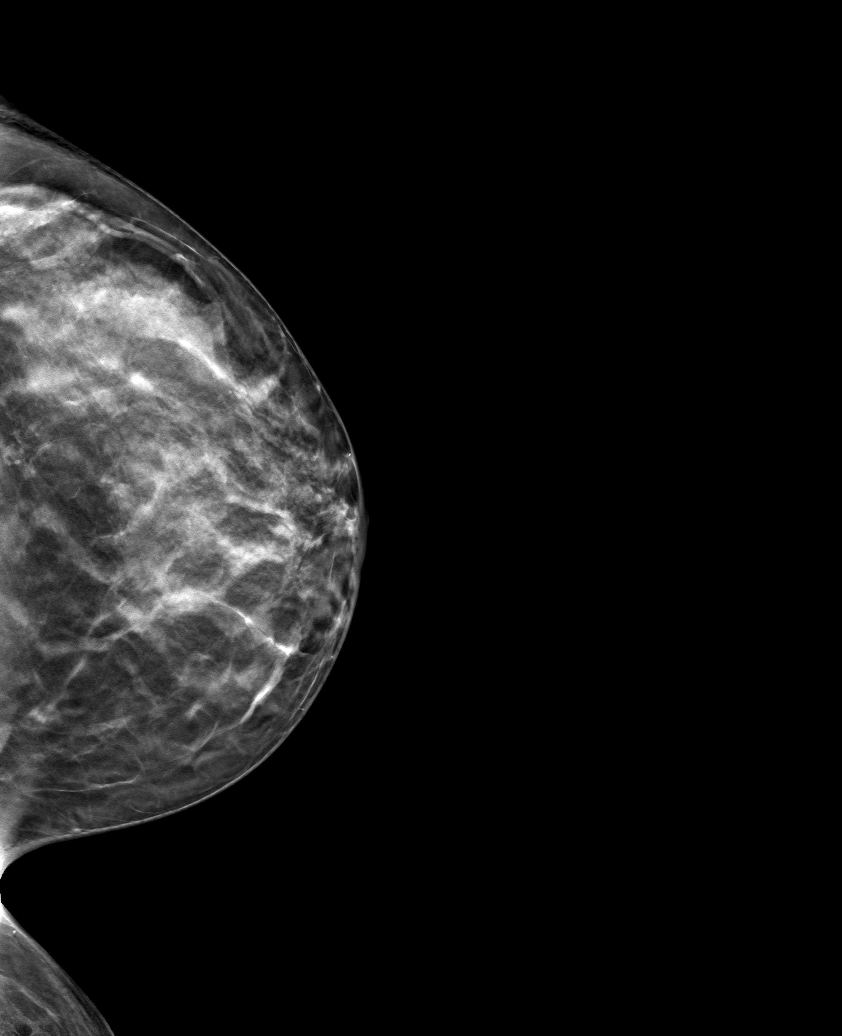

[6 of 30 positions shown; findings below may reference images not displayed]

ACR Breast Density Category c: The breast tissue is heterogeneously
dense, which may obscure small masses.
FINDINGS: There are no findings suspicious for malignancy.
IMPRESSION: No mammographic evidence of malignancy. A result letter of this
screening mammogram will be mailed directly to the patient.

RECOMMENDATION:
Screening mammogram in one year. (Code:Q3-W-BC3)

BI-RADS CATEGORY  1: Negative.

## 2023-03-17 DIAGNOSIS — L821 Other seborrheic keratosis: Secondary | ICD-10-CM | POA: Diagnosis not present

## 2023-03-17 DIAGNOSIS — D225 Melanocytic nevi of trunk: Secondary | ICD-10-CM | POA: Diagnosis not present

## 2023-03-17 DIAGNOSIS — D2262 Melanocytic nevi of left upper limb, including shoulder: Secondary | ICD-10-CM | POA: Diagnosis not present

## 2023-03-17 DIAGNOSIS — D2272 Melanocytic nevi of left lower limb, including hip: Secondary | ICD-10-CM | POA: Diagnosis not present

## 2023-03-17 DIAGNOSIS — D2261 Melanocytic nevi of right upper limb, including shoulder: Secondary | ICD-10-CM | POA: Diagnosis not present

## 2023-03-17 DIAGNOSIS — D2271 Melanocytic nevi of right lower limb, including hip: Secondary | ICD-10-CM | POA: Diagnosis not present

## 2023-04-20 DIAGNOSIS — E559 Vitamin D deficiency, unspecified: Secondary | ICD-10-CM | POA: Diagnosis not present

## 2023-04-20 DIAGNOSIS — E039 Hypothyroidism, unspecified: Secondary | ICD-10-CM | POA: Diagnosis not present

## 2023-08-28 DIAGNOSIS — E039 Hypothyroidism, unspecified: Secondary | ICD-10-CM | POA: Diagnosis not present

## 2023-08-30 DIAGNOSIS — E039 Hypothyroidism, unspecified: Secondary | ICD-10-CM | POA: Diagnosis not present

## 2023-10-25 DIAGNOSIS — D649 Anemia, unspecified: Secondary | ICD-10-CM | POA: Diagnosis not present

## 2023-10-25 DIAGNOSIS — R790 Abnormal level of blood mineral: Secondary | ICD-10-CM | POA: Diagnosis not present

## 2023-10-25 DIAGNOSIS — Z0000 Encounter for general adult medical examination without abnormal findings: Secondary | ICD-10-CM | POA: Diagnosis not present

## 2023-10-25 DIAGNOSIS — R8290 Unspecified abnormal findings in urine: Secondary | ICD-10-CM | POA: Diagnosis not present

## 2023-10-25 DIAGNOSIS — R7989 Other specified abnormal findings of blood chemistry: Secondary | ICD-10-CM | POA: Diagnosis not present

## 2023-10-25 DIAGNOSIS — E039 Hypothyroidism, unspecified: Secondary | ICD-10-CM | POA: Diagnosis not present

## 2023-11-01 DIAGNOSIS — Z1331 Encounter for screening for depression: Secondary | ICD-10-CM | POA: Diagnosis not present

## 2023-11-01 DIAGNOSIS — Z0000 Encounter for general adult medical examination without abnormal findings: Secondary | ICD-10-CM | POA: Diagnosis not present

## 2023-11-01 DIAGNOSIS — R319 Hematuria, unspecified: Secondary | ICD-10-CM | POA: Diagnosis not present

## 2023-11-01 DIAGNOSIS — E039 Hypothyroidism, unspecified: Secondary | ICD-10-CM | POA: Diagnosis not present

## 2023-11-01 DIAGNOSIS — D509 Iron deficiency anemia, unspecified: Secondary | ICD-10-CM | POA: Diagnosis not present

## 2023-11-01 DIAGNOSIS — K047 Periapical abscess without sinus: Secondary | ICD-10-CM | POA: Diagnosis not present

## 2023-11-01 DIAGNOSIS — R635 Abnormal weight gain: Secondary | ICD-10-CM | POA: Diagnosis not present

## 2023-11-01 DIAGNOSIS — Z6826 Body mass index (BMI) 26.0-26.9, adult: Secondary | ICD-10-CM | POA: Diagnosis not present

## 2023-11-05 ENCOUNTER — Encounter: Payer: Self-pay | Admitting: Urology

## 2023-11-08 ENCOUNTER — Encounter: Payer: Self-pay | Admitting: Internal Medicine

## 2023-11-08 ENCOUNTER — Inpatient Hospital Stay: Attending: Internal Medicine | Admitting: Internal Medicine

## 2023-11-08 ENCOUNTER — Inpatient Hospital Stay

## 2023-11-08 VITALS — BP 138/78 | HR 78 | Temp 98.7°F | Resp 18 | Wt 160.0 lb

## 2023-11-08 DIAGNOSIS — K921 Melena: Secondary | ICD-10-CM | POA: Diagnosis not present

## 2023-11-08 DIAGNOSIS — R319 Hematuria, unspecified: Secondary | ICD-10-CM | POA: Diagnosis not present

## 2023-11-08 DIAGNOSIS — Z87440 Personal history of urinary (tract) infections: Secondary | ICD-10-CM | POA: Diagnosis not present

## 2023-11-08 DIAGNOSIS — R42 Dizziness and giddiness: Secondary | ICD-10-CM | POA: Insufficient documentation

## 2023-11-08 DIAGNOSIS — N920 Excessive and frequent menstruation with regular cycle: Secondary | ICD-10-CM | POA: Diagnosis not present

## 2023-11-08 DIAGNOSIS — Z87891 Personal history of nicotine dependence: Secondary | ICD-10-CM | POA: Insufficient documentation

## 2023-11-08 DIAGNOSIS — R5383 Other fatigue: Secondary | ICD-10-CM | POA: Diagnosis not present

## 2023-11-08 DIAGNOSIS — R519 Headache, unspecified: Secondary | ICD-10-CM | POA: Insufficient documentation

## 2023-11-08 DIAGNOSIS — D649 Anemia, unspecified: Secondary | ICD-10-CM

## 2023-11-08 NOTE — Progress Notes (Signed)
 Patient has been having issues with her iron, the iron pills cause stomach issues, she has never had the iron infusions before. Fatigue, and she has been having kidney issues, she has been on Macrobid since she was about 48 years old. She has a urologist appointment  Bladder pain hasn't gone away, which is pretty consistent, she rates her pain at about  a 3. she had the US  done at Dr. Mindi Alto office back in 03/14/2023, she never did hear anything from them, which she had some family issues, so she is here today to see about getting her Iron at a normal level, or at least close to it.

## 2023-11-08 NOTE — Assessment & Plan Note (Addendum)
#   MAY 2025- [PCP] Anemia- Hb 10-; ferritin 5-symptomatic.  Likely due to iron deficiency - from etiology GI blood loss/menorrhagia/malabsorption.  Poor tolerance/lack of improvement on oral iron.    # Discussed regarding IV iron infusion/Venofer. Discussed the potential acute infusion reactions with IV iron; which are quite rare.  Patient understands the risk; will proceed with infusions.   #Etiology of iron deficiency: Urinary/menstrual most likely blood loss.  UA- positive [awaiting evaluation with Dr. Stoioff/cystoscopy]-intermittent blood in stools likely hemorrhoidal -Cologuard 6 months ago negative.  Consider screening colonoscopy.  Await above work up.   Thank you Dr. Kevan Peers for allowing me to participate in the care of your pleasant patient. Please do not hesitate to contact me with questions or concerns in the interim.  Vagal response-needs supine; NO urine pregnancy test [tubal ligation]  # DISPOSITION: # NO labs today # weekly venofer x 4  - next week. # follow up in 2 months-  MD; labs- cbc/bmp;LDH; haptoglobin;  possible venofer- Dr.B  All questions were answered. The patient knows to call the clinic with any problems, questions or concerns.

## 2023-11-08 NOTE — Progress Notes (Signed)
 [facility] CONSULT NOTE  Patient Care Team: Antonio Baumgarten, MD as PCP - General (Internal Medicine) Gwyn Leos, MD as Consulting Physician (Oncology)  CHIEF COMPLAINTS/PURPOSE OF CONSULTATION: ANEMIA   HEMATOLOGY HISTORY  # ANEMIA[Hb; MCV-platelets- WBC; Iron sat; ferritin;  GFR- CT/US - ;   5 Low    --    HISTORY OF PRESENTING ILLNESS:  Pamela Cabrera 48 y.o.  female pleasant patient is  been referred to us  for further evaluation of anemia.  Patient notes to have intermittent blood in stools.  Most likely hemorrhoidal as per patient.  Patient never had a colonoscopy.  Patient had a Cologuard test negative few months ago.   Patient having lower abdominal pain/bladder pain for the last many months.  Status post evaluation with urology follow-up last year.  Patient is currently awaiting the evaluation for possible cystoscopy.  On UA noted to have blood in urine.  Patient has been having issues with her iron, the iron pills cause stomach issues, she has never had the iron infusions before.  Patient has been on oral iron almost 1 year.  Blood in stools: yes- cologard- 6-7 months EGD/colonoscopy- Blood in urine: on UA- followed with Dr.Stoioff-  Prior blood transfusion: none  Kidney/Hx of frequent UTI-  Liver disease: none  Alcohol : none Bariatric surgery:none  Vaginal bleeding: heavy menstrual cycles.  Prior evaluation with hematology:none Prior bone marrow biopsy: none Prior IV iron infusions:none    Review of Systems  Constitutional:  Positive for malaise/fatigue. Negative for chills, diaphoresis, fever and weight loss.  HENT:  Negative for nosebleeds and sore throat.   Eyes:  Negative for double vision.  Respiratory:  Negative for cough, hemoptysis, sputum production, shortness of breath and wheezing.   Cardiovascular:  Negative for chest pain, palpitations, orthopnea and leg swelling.  Gastrointestinal:  Negative for abdominal pain,  blood in stool, constipation, diarrhea, heartburn, melena, nausea and vomiting.  Genitourinary:  Negative for dysuria, frequency and urgency.  Musculoskeletal:  Negative for back pain and joint pain.  Skin: Negative.  Negative for itching and rash.  Neurological:  Positive for dizziness and headaches. Negative for tingling, focal weakness and weakness.  Endo/Heme/Allergies:  Does not bruise/bleed easily.  Psychiatric/Behavioral:  Negative for depression. The patient is not nervous/anxious and does not have insomnia.     MEDICAL HISTORY:  Past Medical History:  Diagnosis Date   Anxiety    GERD (gastroesophageal reflux disease)    Hashimoto's disease    History of migraine headaches    Lumbar pain    Perioral dermatitis    UTI (urinary tract infection)     SURGICAL HISTORY: Past Surgical History:  Procedure Laterality Date   ARTHROSCOPIC REPAIR ACL Right    CHOLECYSTECTOMY     TUBAL LIGATION      SOCIAL HISTORY: Social History   Socioeconomic History   Marital status: Married    Spouse name: Not on file   Number of children: Not on file   Years of education: Not on file   Highest education level: Not on file  Occupational History   Not on file  Tobacco Use   Smoking status: Former    Types: Cigarettes   Smokeless tobacco: Never  Vaping Use   Vaping status: Never Used  Substance and Sexual Activity   Alcohol  use: Yes    Alcohol /week: 1.0 standard drink of alcohol     Types: 1 Glasses of wine per week    Comment: maybe like one glass of wine  every couple of weeks   Drug use: Never   Sexual activity: Yes    Birth control/protection: Surgical    Comment: Tubal ligation/Vasectomy  Other Topics Concern   Not on file  Social History Narrative   Not on file   Social Drivers of Health   Financial Resource Strain: Low Risk  (11/01/2023)   Received from [facility]   Overall Financial Resource Strain (CARDIA)    Difficulty of Paying Living Expenses:  Not hard at all  Food Insecurity: No Food Insecurity (11/08/2023)   Hunger Vital Sign    Worried About Running Out of Food in the Last Year: Never true    Ran Out of Food in the Last Year: Never true  Transportation Needs: No Transportation Needs (11/08/2023)   PRAPARE - Administrator, Civil Service (Medical): No    Lack of Transportation (Non-Medical): No  Physical Activity: Not on file  Stress: Not on file  Social Connections: Not on file  Intimate Partner Violence: Not At Risk (11/08/2023)   Humiliation, Afraid, Rape, and Kick questionnaire    Fear of Current or Ex-Partner: No    Emotionally Abused: No    Physically Abused: No    Sexually Abused: No    FAMILY HISTORY: Family History  Problem Relation Age of Onset   Anemia Mother    Anemia Maternal Grandmother    Breast cancer Neg Hx    Ovarian cancer Neg Hx     ALLERGIES:  is allergic to sulfa antibiotics.  MEDICATIONS:  Current Outpatient Medications  Medication Sig Dispense Refill   folic acid  (FOLVITE ) 1 MG tablet Take 1 mg by mouth.     iron polysaccharides (NIFEREX) 150 MG capsule Take 150 mg by mouth.     levothyroxine  (SYNTHROID ) 125 MCG tablet Take 125 mcg by mouth.     penicillin v potassium (VEETID) 500 MG tablet Take 500 mg by mouth.     No current facility-administered medications for this visit.     PHYSICAL EXAMINATION:   Vitals:   11/08/23 1408  BP: 138/78  Pulse: 78  Resp: 18  Temp: 98.7 F (37.1 C)  SpO2: 100%   Filed Weights   11/08/23 1408  Weight: 160 lb (72.6 kg)    Physical Exam Vitals and nursing note reviewed.  HENT:     Head: Normocephalic and atraumatic.     Mouth/Throat:     Pharynx: Oropharynx is clear.  Eyes:     Extraocular Movements: Extraocular movements intact.     Pupils: Pupils are equal, round, and reactive to light.  Cardiovascular:     Rate and Rhythm: Normal rate and regular rhythm.  Pulmonary:     Effort: Pulmonary effort is normal.     Breath  sounds: Normal breath sounds.  Abdominal:     Palpations: Abdomen is soft.  Musculoskeletal:        General: Normal range of motion.     Cervical back: Normal range of motion.  Skin:    General: Skin is warm.  Neurological:     General: No focal deficit present.     Mental Status: She is alert and oriented to person, place, and time.  Psychiatric:        Behavior: Behavior normal.        Judgment: Judgment normal.      LABORATORY DATA:  I have reviewed the data as listed Lab Results  Component Value Date   WBC 6.8 09/25/2019  HGB 12.9 09/25/2019   HCT 37.6 09/25/2019   MCV 89 09/25/2019   PLT 213 09/25/2019   No results for input(s): "NA", "K", "CL", "CO2", "GLUCOSE", "BUN", "CREATININE", "CALCIUM", "GFRNONAA", "GFRAA", "PROT", "ALBUMIN", "AST", "ALT", "ALKPHOS", "BILITOT", "BILIDIR", "IBILI" in the last 8760 hours.   No results found.  ASSESSMENT & PLAN:   Symptomatic anemia # MAY 2025- [PCP] Anemia- Hb 10-; ferritin 5-symptomatic.  Likely due to iron deficiency - from etiology GI blood loss/menorrhagia/malabsorption.  Poor tolerance/lack of improvement on oral iron.    # Discussed regarding IV iron infusion/Venofer. Discussed the potential acute infusion reactions with IV iron; which are quite rare.  Patient understands the risk; will proceed with infusions.   #Etiology of iron deficiency: Urinary/menstrual most likely blood loss.  UA- positive [awaiting evaluation with Dr. Stoioff/cystoscopy]-intermittent blood in stools likely hemorrhoidal -Cologuard 6 months ago negative.  Consider screening colonoscopy.  Await above work up.   Thank you Dr. Kevan Peers for allowing me to participate in the care of your pleasant patient. Please do not hesitate to contact me with questions or concerns in the interim.  Vagal response-needs supine; NO urine pregnancy test [tubal ligation]  # DISPOSITION: # NO labs today # weekly venofer x 4  - next week. # follow up in 2 months-  MD;  labs- cbc/bmp;LDH; haptoglobin;  possible venofer- Dr.B  All questions were answered. The patient knows to call the clinic with any problems, questions or concerns.     Gwyn Leos, MD 11/08/2023 3:46 PM

## 2023-11-10 ENCOUNTER — Inpatient Hospital Stay

## 2023-11-10 VITALS — BP 114/74 | HR 76 | Temp 98.9°F | Resp 16

## 2023-11-10 DIAGNOSIS — D649 Anemia, unspecified: Secondary | ICD-10-CM | POA: Diagnosis not present

## 2023-11-10 DIAGNOSIS — Z87440 Personal history of urinary (tract) infections: Secondary | ICD-10-CM | POA: Diagnosis not present

## 2023-11-10 DIAGNOSIS — R319 Hematuria, unspecified: Secondary | ICD-10-CM | POA: Diagnosis not present

## 2023-11-10 DIAGNOSIS — R519 Headache, unspecified: Secondary | ICD-10-CM | POA: Diagnosis not present

## 2023-11-10 DIAGNOSIS — R42 Dizziness and giddiness: Secondary | ICD-10-CM | POA: Diagnosis not present

## 2023-11-10 DIAGNOSIS — R5383 Other fatigue: Secondary | ICD-10-CM | POA: Diagnosis not present

## 2023-11-10 DIAGNOSIS — Z87891 Personal history of nicotine dependence: Secondary | ICD-10-CM | POA: Diagnosis not present

## 2023-11-10 DIAGNOSIS — N920 Excessive and frequent menstruation with regular cycle: Secondary | ICD-10-CM | POA: Diagnosis not present

## 2023-11-10 DIAGNOSIS — K921 Melena: Secondary | ICD-10-CM | POA: Diagnosis not present

## 2023-11-10 MED ORDER — IRON SUCROSE 20 MG/ML IV SOLN
200.0000 mg | Freq: Once | INTRAVENOUS | Status: AC
  Administered 2023-11-10: 200 mg via INTRAVENOUS
  Filled 2023-11-10: qty 10

## 2023-11-10 NOTE — Patient Instructions (Signed)

## 2023-11-17 ENCOUNTER — Inpatient Hospital Stay

## 2023-11-17 ENCOUNTER — Ambulatory Visit (INDEPENDENT_AMBULATORY_CARE_PROVIDER_SITE_OTHER): Admitting: Physician Assistant

## 2023-11-17 VITALS — BP 123/83 | HR 83 | Ht 66.0 in | Wt 156.0 lb

## 2023-11-17 VITALS — BP 117/74 | HR 78 | Temp 98.8°F | Resp 16

## 2023-11-17 DIAGNOSIS — D649 Anemia, unspecified: Secondary | ICD-10-CM | POA: Diagnosis not present

## 2023-11-17 DIAGNOSIS — R3989 Other symptoms and signs involving the genitourinary system: Secondary | ICD-10-CM | POA: Diagnosis not present

## 2023-11-17 DIAGNOSIS — R42 Dizziness and giddiness: Secondary | ICD-10-CM | POA: Diagnosis not present

## 2023-11-17 DIAGNOSIS — R3129 Other microscopic hematuria: Secondary | ICD-10-CM | POA: Diagnosis not present

## 2023-11-17 DIAGNOSIS — R519 Headache, unspecified: Secondary | ICD-10-CM | POA: Diagnosis not present

## 2023-11-17 DIAGNOSIS — K921 Melena: Secondary | ICD-10-CM | POA: Diagnosis not present

## 2023-11-17 DIAGNOSIS — Z87891 Personal history of nicotine dependence: Secondary | ICD-10-CM | POA: Diagnosis not present

## 2023-11-17 DIAGNOSIS — Z87440 Personal history of urinary (tract) infections: Secondary | ICD-10-CM | POA: Diagnosis not present

## 2023-11-17 DIAGNOSIS — N920 Excessive and frequent menstruation with regular cycle: Secondary | ICD-10-CM | POA: Diagnosis not present

## 2023-11-17 DIAGNOSIS — R5383 Other fatigue: Secondary | ICD-10-CM | POA: Diagnosis not present

## 2023-11-17 DIAGNOSIS — R319 Hematuria, unspecified: Secondary | ICD-10-CM | POA: Diagnosis not present

## 2023-11-17 LAB — URINALYSIS, COMPLETE
Bilirubin, UA: NEGATIVE
Glucose, UA: NEGATIVE
Ketones, UA: NEGATIVE
Leukocytes,UA: NEGATIVE
Nitrite, UA: NEGATIVE
Protein,UA: NEGATIVE
Specific Gravity, UA: 1.01 (ref 1.005–1.030)
Urobilinogen, Ur: 0.2 mg/dL (ref 0.2–1.0)
pH, UA: 5.5 (ref 5.0–7.5)

## 2023-11-17 LAB — MICROSCOPIC EXAMINATION

## 2023-11-17 MED ORDER — IRON SUCROSE 20 MG/ML IV SOLN
200.0000 mg | Freq: Once | INTRAVENOUS | Status: AC
  Administered 2023-11-17: 200 mg via INTRAVENOUS
  Filled 2023-11-17: qty 10

## 2023-11-17 NOTE — Patient Instructions (Signed)

## 2023-11-17 NOTE — H&P (View-Only) (Signed)
 11/17/2023 4:11 PM   Earnie Doralee Gallop Jan 19, 1976 [id number]  CC: Chief Complaint  Patient presents with   Bladder pain   HPI: Chanelle Jenette Rayson is a 48 y.o. female with PMH recurrent UTI previously on postcoital prophylaxis and microscopic hematuria who presents today for follow-up of ongoing bladder pain.  He is accompanied today by her husband, who contributes to HPI.  She saw Dr. Cherylene Corrente on 03/06/2019 for with reports of 2 weeks of bladder pain.  She had microscopic hematuria at the time.  Renal ultrasound was negative.  Today she reports ongoing bladder pain that is relieved with voiding.  She has a niece with interstitial cystitis.  She denies gross hematuria and has a 10-15-pack-year history of smoking.  No vaginal dryness or irritation, though she is perimenopausal.  In-office UA today positive for 1+ blood; urine microscopy with 3-10 RBCs/HPF.  PMH: Past Medical History:  Diagnosis Date   Anxiety    GERD (gastroesophageal reflux disease)    Hashimoto's disease    History of migraine headaches    Lumbar pain    Perioral dermatitis    UTI (urinary tract infection)     Surgical History: Past Surgical History:  Procedure Laterality Date   ARTHROSCOPIC REPAIR ACL Right    CHOLECYSTECTOMY     TUBAL LIGATION      Home Medications:  Allergies as of 11/17/2023       Reactions   Sulfa Antibiotics Anaphylaxis, Hives, Itching        Medication List        Accurate as of November 17, 2023  4:11 PM. If you have any questions, ask your nurse or doctor.          folic acid  1 MG tablet Commonly known as: FOLVITE  Take 1 mg by mouth.   iron  polysaccharides 150 MG capsule Commonly known as: NIFEREX Take 150 mg by mouth.   levothyroxine  125 MCG tablet Commonly known as: SYNTHROID  Take 125 mcg by mouth.        Allergies:  Allergies  Allergen Reactions   Sulfa Antibiotics Anaphylaxis, Hives and Itching    Family History: Family History  Problem  Relation Age of Onset   Anemia Mother    Anemia Maternal Grandmother    Breast cancer Neg Hx    Ovarian cancer Neg Hx     Social History:   reports that she has quit smoking. Her smoking use included cigarettes. She has never used smokeless tobacco. She reports current alcohol  use of about 1.0 standard drink of alcohol  per week. She reports that she does not use drugs.  Physical Exam: BP 123/83   Pulse 83   Ht 5' 6 (1.676 m)   Wt 156 lb (70.8 kg)   BMI 25.18 kg/m   Constitutional:  Alert and oriented, no acute distress, nontoxic appearing HEENT: [place], AT Cardiovascular: No clubbing, cyanosis, or edema Respiratory: Normal respiratory effort, no increased work of breathing Skin: No rashes, bruises or suspicious lesions Neurologic: Grossly intact, no focal deficits, moving all 4 extremities Psychiatric: Normal mood and affect  Laboratory Data: Results for orders placed or performed in visit on 11/17/23  Microscopic Examination   Collection Time: 11/17/23  3:46 PM   Urine  Result Value Ref Range   WBC, UA 0-5 0 - 5 /hpf   RBC, Urine 3-10 (A) 0 - 2 /hpf   Epithelial Cells (non renal) 0-10 0 - 10 /hpf   Bacteria, UA Few None seen/Few  Urinalysis, Complete  Collection Time: 11/17/23  3:46 PM  Result Value Ref Range   Specific Gravity, UA 1.010 1.005 - 1.030   pH, UA 5.5 5.0 - 7.5   Color, UA Yellow Yellow   Appearance Ur Clear Clear   Leukocytes,UA Negative Negative   Protein,UA Negative Negative/Trace   Glucose, UA Negative Negative   Ketones, UA Negative Negative   RBC, UA 1+ (A) Negative   Bilirubin, UA Negative Negative   Urobilinogen, Ur 0.2 0.2 - 1.0 mg/dL   Nitrite, UA Negative Negative   Microscopic Examination See below:    Assessment & Plan:   1. Bladder pain (Primary) 9 months of continuous bladder pain and microscopic hematuria in this perimenopausal patient with a family history of IC and current smoking history.  Differential includes IC versus atypical  infection versus nephrolithiasis.  We discussed other less common sources of blood in the urine including urologic malignancies.  UA bland other than microscopic hematuria.  Will send for atypical culture and treat as indicated.  Will plan for CT stone study and anticipate follow-up ureteroscopy versus cystoscopy and hydrodistention with Dr. Cherylene Corrente per results.  She is in agreement. - Urinalysis, Complete - Mycoplasma / ureaplasma culture - CT RENAL STONE STUDY; Future   Return for Will call with results.  Kathreen Pare, PA-C  [facility] [place] [address] [place], [place] [address] [phone]

## 2023-11-17 NOTE — Progress Notes (Signed)
 11/17/2023 4:11 PM   Pamela Cabrera Jan 19, 1976 [id number]  CC: Chief Complaint  Patient presents with   Bladder pain   HPI: Pamela Cabrera is a 48 y.o. female with PMH recurrent UTI previously on postcoital prophylaxis and microscopic hematuria who presents today for follow-up of ongoing bladder pain.  He is accompanied today by her husband, who contributes to HPI.  She saw Dr. Cherylene Corrente on 03/06/2019 for with reports of 2 weeks of bladder pain.  She had microscopic hematuria at the time.  Renal ultrasound was negative.  Today she reports ongoing bladder pain that is relieved with voiding.  She has a niece with interstitial cystitis.  She denies gross hematuria and has a 10-15-pack-year history of smoking.  No vaginal dryness or irritation, though she is perimenopausal.  In-office UA today positive for 1+ blood; urine microscopy with 3-10 RBCs/HPF.  PMH: Past Medical History:  Diagnosis Date   Anxiety    GERD (gastroesophageal reflux disease)    Hashimoto's disease    History of migraine headaches    Lumbar pain    Perioral dermatitis    UTI (urinary tract infection)     Surgical History: Past Surgical History:  Procedure Laterality Date   ARTHROSCOPIC REPAIR ACL Right    CHOLECYSTECTOMY     TUBAL LIGATION      Home Medications:  Allergies as of 11/17/2023       Reactions   Sulfa Antibiotics Anaphylaxis, Hives, Itching        Medication List        Accurate as of November 17, 2023  4:11 PM. If you have any questions, ask your nurse or doctor.          folic acid  1 MG tablet Commonly known as: FOLVITE  Take 1 mg by mouth.   iron  polysaccharides 150 MG capsule Commonly known as: NIFEREX Take 150 mg by mouth.   levothyroxine  125 MCG tablet Commonly known as: SYNTHROID  Take 125 mcg by mouth.        Allergies:  Allergies  Allergen Reactions   Sulfa Antibiotics Anaphylaxis, Hives and Itching    Family History: Family History  Problem  Relation Age of Onset   Anemia Mother    Anemia Maternal Grandmother    Breast cancer Neg Hx    Ovarian cancer Neg Hx     Social History:   reports that she has quit smoking. Her smoking use included cigarettes. She has never used smokeless tobacco. She reports current alcohol  use of about 1.0 standard drink of alcohol  per week. She reports that she does not use drugs.  Physical Exam: BP 123/83   Pulse 83   Ht 5' 6 (1.676 m)   Wt 156 lb (70.8 kg)   BMI 25.18 kg/m   Constitutional:  Alert and oriented, no acute distress, nontoxic appearing HEENT: [place], AT Cardiovascular: No clubbing, cyanosis, or edema Respiratory: Normal respiratory effort, no increased work of breathing Skin: No rashes, bruises or suspicious lesions Neurologic: Grossly intact, no focal deficits, moving all 4 extremities Psychiatric: Normal mood and affect  Laboratory Data: Results for orders placed or performed in visit on 11/17/23  Microscopic Examination   Collection Time: 11/17/23  3:46 PM   Urine  Result Value Ref Range   WBC, UA 0-5 0 - 5 /hpf   RBC, Urine 3-10 (A) 0 - 2 /hpf   Epithelial Cells (non renal) 0-10 0 - 10 /hpf   Bacteria, UA Few None seen/Few  Urinalysis, Complete  Collection Time: 11/17/23  3:46 PM  Result Value Ref Range   Specific Gravity, UA 1.010 1.005 - 1.030   pH, UA 5.5 5.0 - 7.5   Color, UA Yellow Yellow   Appearance Ur Clear Clear   Leukocytes,UA Negative Negative   Protein,UA Negative Negative/Trace   Glucose, UA Negative Negative   Ketones, UA Negative Negative   RBC, UA 1+ (A) Negative   Bilirubin, UA Negative Negative   Urobilinogen, Ur 0.2 0.2 - 1.0 mg/dL   Nitrite, UA Negative Negative   Microscopic Examination See below:    Assessment & Plan:   1. Bladder pain (Primary) 9 months of continuous bladder pain and microscopic hematuria in this perimenopausal patient with a family history of IC and current smoking history.  Differential includes IC versus atypical  infection versus nephrolithiasis.  We discussed other less common sources of blood in the urine including urologic malignancies.  UA bland other than microscopic hematuria.  Will send for atypical culture and treat as indicated.  Will plan for CT stone study and anticipate follow-up ureteroscopy versus cystoscopy and hydrodistention with Dr. Cherylene Corrente per results.  She is in agreement. - Urinalysis, Complete - Mycoplasma / ureaplasma culture - CT RENAL STONE STUDY; Future   Return for Will call with results.  Kathreen Pare, PA-C  [facility] [place] [address] [place], [place] [address] [phone]

## 2023-11-20 ENCOUNTER — Telehealth: Payer: Self-pay | Admitting: Physician Assistant

## 2023-11-20 ENCOUNTER — Other Ambulatory Visit: Payer: Self-pay | Admitting: Physician Assistant

## 2023-11-20 ENCOUNTER — Inpatient Hospital Stay

## 2023-11-20 VITALS — BP 120/77 | HR 79 | Temp 99.2°F | Resp 18

## 2023-11-20 DIAGNOSIS — N920 Excessive and frequent menstruation with regular cycle: Secondary | ICD-10-CM | POA: Diagnosis not present

## 2023-11-20 DIAGNOSIS — R519 Headache, unspecified: Secondary | ICD-10-CM | POA: Diagnosis not present

## 2023-11-20 DIAGNOSIS — Z87440 Personal history of urinary (tract) infections: Secondary | ICD-10-CM | POA: Diagnosis not present

## 2023-11-20 DIAGNOSIS — K921 Melena: Secondary | ICD-10-CM | POA: Diagnosis not present

## 2023-11-20 DIAGNOSIS — R42 Dizziness and giddiness: Secondary | ICD-10-CM | POA: Diagnosis not present

## 2023-11-20 DIAGNOSIS — D649 Anemia, unspecified: Secondary | ICD-10-CM | POA: Diagnosis not present

## 2023-11-20 DIAGNOSIS — R319 Hematuria, unspecified: Secondary | ICD-10-CM | POA: Diagnosis not present

## 2023-11-20 DIAGNOSIS — Z87891 Personal history of nicotine dependence: Secondary | ICD-10-CM | POA: Diagnosis not present

## 2023-11-20 DIAGNOSIS — R5383 Other fatigue: Secondary | ICD-10-CM | POA: Diagnosis not present

## 2023-11-20 MED ORDER — SODIUM CHLORIDE 0.9% FLUSH
10.0000 mL | Freq: Once | INTRAVENOUS | Status: AC | PRN
  Administered 2023-11-20: 10 mL
  Filled 2023-11-20: qty 10

## 2023-11-20 MED ORDER — IRON SUCROSE 20 MG/ML IV SOLN
200.0000 mg | Freq: Once | INTRAVENOUS | Status: AC
  Administered 2023-11-20: 200 mg via INTRAVENOUS

## 2023-11-20 NOTE — Telephone Encounter (Signed)
 Location changed to [facility]

## 2023-11-20 NOTE — Addendum Note (Signed)
 Addended byKatina Parlor on: 11/20/2023 11:09 AM   Modules accepted: Orders

## 2023-11-20 NOTE — Addendum Note (Signed)
 Addended byKatina Parlor on: 11/20/2023 10:51 AM   Modules accepted: Orders

## 2023-11-20 NOTE — Telephone Encounter (Signed)
 Received call from [facility]. Patient wants to have CT done there instead of [facility]. Patient has appointment scheduled there on 11/22/23. Order needs to be changed by 11/21/23 at 10:00 am per [facility].

## 2023-11-21 ENCOUNTER — Ambulatory Visit

## 2023-11-22 ENCOUNTER — Ambulatory Visit
Admission: RE | Admit: 2023-11-22 | Discharge: 2023-11-22 | Disposition: A | Discharge status: Home / Self Care | Source: Ambulatory Visit | Attending: Physician Assistant | Admitting: Physician Assistant

## 2023-11-22 DIAGNOSIS — R3129 Other microscopic hematuria: Secondary | ICD-10-CM | POA: Diagnosis not present

## 2023-11-22 DIAGNOSIS — R3989 Other symptoms and signs involving the genitourinary system: Secondary | ICD-10-CM

## 2023-11-22 DIAGNOSIS — I700 Atherosclerosis of aorta: Secondary | ICD-10-CM | POA: Diagnosis not present

## 2023-11-23 DIAGNOSIS — R3989 Other symptoms and signs involving the genitourinary system: Secondary | ICD-10-CM

## 2023-11-24 ENCOUNTER — Telehealth: Payer: Self-pay

## 2023-11-24 ENCOUNTER — Other Ambulatory Visit: Payer: Self-pay

## 2023-11-24 DIAGNOSIS — R3989 Other symptoms and signs involving the genitourinary system: Secondary | ICD-10-CM

## 2023-11-24 LAB — MYCOPLASMA / UREAPLASMA CULTURE
Mycoplasma hominis Culture: NEGATIVE
Ureaplasma urealyticum: NEGATIVE

## 2023-11-24 NOTE — Progress Notes (Signed)
   [facility] Surgical Posting Form  Surgery Date: Date: 12/05/2023  Surgeon: Dr. Darlynn Elam, MD  Inpt ( No  )   Outpt (Yes)   Obs ( No  )   Diagnosis: R39.89 Bladder Pain  -CPT: 52260, 38756, 43329  Surgery: Cystoscopy with Hydrodistention, Possible Bladder Fulguration, Possible Bladder Biopsy  Stop Anticoagulations: Yes  Cardiac/Medical/Pulmonary Clearance needed: no  *Orders entered into EPIC  Date: 11/24/23   *Case booked in EPIC  Date: 11/24/23  *Notified pt of Surgery: Date: 11/24/23  PRE-OP UA & CX: Yes, Will obtain in clinic on 11/27/2023  *Placed into Prior Authorization Work [place] Date: 11/24/23  Assistant/laser/rep:No

## 2023-11-24 NOTE — Telephone Encounter (Signed)
 I just spoke with the patient via telephone.  We reviewed her CT scan results, which were negative for urolithiasis.  I offered her cystoscopy with hydrodistention, possible fulguration, possible bladder biopsy with Dr. Cherylene Corrente and she agreed.  Orders in.

## 2023-11-24 NOTE — Progress Notes (Signed)
 Surgical Physician Order Form [facility] [place]  Dr. Cherylene Corrente * Scheduling expectation : Next Available (7/1 preferred)  *Length of Case:   *Clearance needed: no  *Anticoagulation Instructions: N/A  *Aspirin Instructions: N/A  *Post-op visit Date/Instructions:  TBD  *Diagnosis: Bladder pain  *Procedure: Cystoscopy with hydrodistention, possible fulguration, possible bladder biopsy  Additional orders: N/A  -Admit type: OUTpatient  -Anesthesia: General  -VTE Prophylaxis Standing Order SCD's       Other:   -Standing Lab Orders Per Anesthesia    Lab other: UA&Urine Culture  -Standing Test orders EKG/Chest x-ray per Anesthesia       Test other:   - Medications:  Ancef 2gm IV  -Other orders:  N/A

## 2023-11-24 NOTE — Telephone Encounter (Signed)
 Per Dr. Cherylene Corrente, Patient is to be scheduled for Cystoscopy with Hydrodistention, Possible Bladder Fulguration, Possible Bladder Biopsy   Pamela Cabrera was contacted and possible surgical dates were discussed, Tuesday July 1st, 2025 was agreed upon for surgery.   Patient was instructed that Dr. Cherylene Corrente will require them to provide a pre-op UA & CX prior to surgery. This was ordered and scheduled drop off appointment was made for 11/27/2023.    Patient was directed to call [phone] between 1-3pm the day before surgery to find out surgical arrival time.  Instructions were given not to eat or drink from midnight on the night before surgery and have a driver for the day of surgery. On the surgery day patient was instructed to enter through the Medical Mall entrance of [facility] report the Same Day Surgery desk.   Pre-Admit Testing will be in contact via phone to set up an interview with the anesthesia team to review your history and medications prior to surgery.   Reminder of this information was sent via MyChart to the patient.

## 2023-11-27 ENCOUNTER — Other Ambulatory Visit

## 2023-11-27 DIAGNOSIS — R3989 Other symptoms and signs involving the genitourinary system: Secondary | ICD-10-CM | POA: Diagnosis not present

## 2023-11-27 LAB — URINALYSIS, COMPLETE
Bilirubin, UA: NEGATIVE
Glucose, UA: NEGATIVE
Leukocytes,UA: NEGATIVE
Nitrite, UA: NEGATIVE
Protein,UA: NEGATIVE
Specific Gravity, UA: 1.025 (ref 1.005–1.030)
Urobilinogen, Ur: 0.2 mg/dL (ref 0.2–1.0)
pH, UA: 6 (ref 5.0–7.5)

## 2023-11-27 LAB — MICROSCOPIC EXAMINATION

## 2023-11-30 ENCOUNTER — Other Ambulatory Visit: Payer: Self-pay

## 2023-11-30 ENCOUNTER — Encounter
Admission: RE | Admit: 2023-11-30 | Discharge: 2023-11-30 | Disposition: A | Discharge status: Home / Self Care | Source: Ambulatory Visit | Attending: Urology | Admitting: Urology

## 2023-11-30 VITALS — Ht 66.0 in | Wt 155.0 lb

## 2023-11-30 DIAGNOSIS — E039 Hypothyroidism, unspecified: Secondary | ICD-10-CM | POA: Diagnosis not present

## 2023-11-30 DIAGNOSIS — Z01818 Encounter for other preprocedural examination: Secondary | ICD-10-CM

## 2023-11-30 HISTORY — DX: Anemia, unspecified: D64.9

## 2023-11-30 HISTORY — DX: Other specified postprocedural states: R11.2

## 2023-11-30 LAB — CULTURE, URINE COMPREHENSIVE

## 2023-11-30 NOTE — Patient Instructions (Signed)
 Your procedure is scheduled on: Tuesday 12/05/23 Report to the Registration Desk on the 1st floor of the Medical Mall. To find out your arrival time, please call [phone] between 1PM - 3PM on: Monday 12/04/23 If your arrival time is 6:00 am, do not arrive before that time as the Medical Mall entrance doors do not open until 6:00 am.  REMEMBER: Instructions that are not followed completely may result in serious medical risk, up to and including death; or upon the discretion of your surgeon and anesthesiologist your surgery may need to be rescheduled.  Do not eat food or drink any liquids after midnight the night before surgery.  No gum chewing or hard candies.  One week prior to surgery: Stop Anti-inflammatories (NSAIDS) such as Advil, Aleve, Ibuprofen, Motrin, Naproxen, Naprosyn and Aspirin based products such as Excedrin, Goody's Powder, BC Powder. Stop ANY OVER THE COUNTER supplements and vitamins until after surgery.  You may however, continue to take Tylenol if needed for pain up until the day of surgery.  Continue taking all of your other prescription medications up until the day of surgery.  ON THE DAY OF SURGERY ONLY TAKE THESE MEDICATIONS WITH SIPS OF WATER:  levothyroxine  (SYNTHROID ) 125 MCG tablet   No Alcohol  for 24 hours before or after surgery.  No Smoking including e-cigarettes for 24 hours before surgery.  No chewable tobacco products for at least 6 hours before surgery.  No nicotine patches on the day of surgery.  Do not use any recreational drugs for at least a week (preferably 2 weeks) before your surgery.  Please be advised that the combination of cocaine and anesthesia may have negative outcomes, up to and including death. If you test positive for cocaine, your surgery will be cancelled.  On the morning of surgery brush your teeth with toothpaste and water, you may rinse your mouth with mouthwash if you wish. Do not swallow any toothpaste or  mouthwash.  Shower prior to arrival the day of surgeryt.  Do not wear jewelry, make-up, hairpins, clips or nail polish.  For welded (permanent) jewelry: bracelets, anklets, waist bands, etc.  Please have this removed prior to surgery.  If it is not removed, there is a chance that hospital personnel will need to cut it off on the day of surgery.  Do not wear lotions, powders, or perfumes.   Do not shave body hair from the neck down 48 hours before surgery.  Contact lenses, hearing aids and dentures may not be worn into surgery.  Do not bring valuables to the hospital. [facility] is not responsible for any missing/lost belongings or valuables.   Notify your doctor if there is any change in your medical condition (cold, fever, infection).  Wear comfortable clothing (specific to your surgery type) to the hospital.  After surgery, you can help prevent lung complications by doing breathing exercises.  Take deep breaths and cough every 1-2 hours. Your doctor may order a device called an Incentive Spirometer to help you take deep breaths.  If you are being discharged the day of surgery, you will not be allowed to drive home. You will need a responsible individual to drive you home and stay with you for 24 hours after surgery.   If you are taking public transportation, you will need to have a responsible individual with you.  Please call the Pre-admissions Testing Dept. at [phone] if you have any questions about these instructions.  Surgery Visitation Policy:  Patients having surgery or a  procedure may have two visitors.  Children under the age of 8 must have an adult with them who is not the patient.   Merchandiser, retail to address health-related social needs:  [email or web address]

## 2023-12-01 ENCOUNTER — Ambulatory Visit: Payer: Self-pay | Admitting: Physician Assistant

## 2023-12-01 ENCOUNTER — Inpatient Hospital Stay

## 2023-12-01 VITALS — BP 109/65 | HR 78 | Temp 99.2°F | Resp 18

## 2023-12-01 DIAGNOSIS — D649 Anemia, unspecified: Secondary | ICD-10-CM

## 2023-12-01 DIAGNOSIS — R319 Hematuria, unspecified: Secondary | ICD-10-CM | POA: Diagnosis not present

## 2023-12-01 DIAGNOSIS — Z87891 Personal history of nicotine dependence: Secondary | ICD-10-CM | POA: Diagnosis not present

## 2023-12-01 DIAGNOSIS — K921 Melena: Secondary | ICD-10-CM | POA: Diagnosis not present

## 2023-12-01 DIAGNOSIS — R5383 Other fatigue: Secondary | ICD-10-CM | POA: Diagnosis not present

## 2023-12-01 DIAGNOSIS — R42 Dizziness and giddiness: Secondary | ICD-10-CM | POA: Diagnosis not present

## 2023-12-01 DIAGNOSIS — R519 Headache, unspecified: Secondary | ICD-10-CM | POA: Diagnosis not present

## 2023-12-01 DIAGNOSIS — Z87440 Personal history of urinary (tract) infections: Secondary | ICD-10-CM | POA: Diagnosis not present

## 2023-12-01 DIAGNOSIS — N920 Excessive and frequent menstruation with regular cycle: Secondary | ICD-10-CM | POA: Diagnosis not present

## 2023-12-01 MED ORDER — IRON SUCROSE 20 MG/ML IV SOLN
200.0000 mg | Freq: Once | INTRAVENOUS | Status: AC
  Administered 2023-12-01: 200 mg via INTRAVENOUS

## 2023-12-01 MED ORDER — SODIUM CHLORIDE 0.9% FLUSH
10.0000 mL | Freq: Once | INTRAVENOUS | Status: AC | PRN
  Administered 2023-12-01: 10 mL
  Filled 2023-12-01: qty 10

## 2023-12-04 MED ORDER — CEFAZOLIN SODIUM-DEXTROSE 2-4 GM/100ML-% IV SOLN
2.0000 g | INTRAVENOUS | Status: AC
  Administered 2023-12-05: 2 g via INTRAVENOUS

## 2023-12-04 MED ORDER — CHLORHEXIDINE GLUCONATE 0.12 % MT SOLN
15.0000 mL | Freq: Once | OROMUCOSAL | Status: DC
Start: 2023-12-04 — End: 2023-12-05

## 2023-12-04 MED ORDER — ORAL CARE MOUTH RINSE
15.0000 mL | Freq: Once | OROMUCOSAL | Status: DC
Start: 2023-12-04 — End: 2023-12-05

## 2023-12-04 MED ORDER — LACTATED RINGERS IV SOLN: INTRAVENOUS | Status: DC

## 2023-12-05 ENCOUNTER — Encounter
Admission: RE | Disposition: A | Discharge status: Home / Self Care | Payer: Self-pay | Source: Home / Self Care | Attending: Urology

## 2023-12-05 ENCOUNTER — Ambulatory Visit: Admitting: Anesthesiology

## 2023-12-05 ENCOUNTER — Ambulatory Visit
Admission: RE | Admit: 2023-12-05 | Discharge: 2023-12-05 | Disposition: A | Discharge status: Home / Self Care | Attending: Urology | Admitting: Urology

## 2023-12-05 ENCOUNTER — Encounter: Payer: Self-pay | Admitting: Urology

## 2023-12-05 ENCOUNTER — Other Ambulatory Visit: Payer: Self-pay

## 2023-12-05 DIAGNOSIS — Z01818 Encounter for other preprocedural examination: Secondary | ICD-10-CM

## 2023-12-05 DIAGNOSIS — K219 Gastro-esophageal reflux disease without esophagitis: Secondary | ICD-10-CM | POA: Insufficient documentation

## 2023-12-05 DIAGNOSIS — R3129 Other microscopic hematuria: Secondary | ICD-10-CM | POA: Diagnosis not present

## 2023-12-05 DIAGNOSIS — R102 Pelvic and perineal pain: Secondary | ICD-10-CM

## 2023-12-05 DIAGNOSIS — Z87440 Personal history of urinary (tract) infections: Secondary | ICD-10-CM | POA: Diagnosis not present

## 2023-12-05 DIAGNOSIS — R3989 Other symptoms and signs involving the genitourinary system: Secondary | ICD-10-CM | POA: Diagnosis not present

## 2023-12-05 DIAGNOSIS — Z87891 Personal history of nicotine dependence: Secondary | ICD-10-CM | POA: Diagnosis not present

## 2023-12-05 HISTORY — PX: CYSTO WITH HYDRODISTENSION: SHX5453

## 2023-12-05 LAB — POCT PREGNANCY, URINE: Preg Test, Ur: NEGATIVE

## 2023-12-05 SURGERY — CYSTOSCOPY, WITH BLADDER HYDRODISTENSION
Anesthesia: General

## 2023-12-05 MED ORDER — ACETAMINOPHEN 500 MG PO TABS
1000.0000 mg | ORAL_TABLET | Freq: Once | ORAL | Status: AC
  Administered 2023-12-05: 1000 mg via ORAL

## 2023-12-05 MED ORDER — KETOROLAC TROMETHAMINE 30 MG/ML IJ SOLN
INTRAMUSCULAR | Status: DC | PRN
  Administered 2023-12-05: 30 mg via INTRAVENOUS

## 2023-12-05 MED ORDER — FENTANYL CITRATE (PF) 100 MCG/2ML IJ SOLN
INTRAMUSCULAR | Status: DC | PRN
  Administered 2023-12-05 (×2): 25 ug via INTRAVENOUS

## 2023-12-05 MED ORDER — DEXMEDETOMIDINE HCL IN NACL 80 MCG/20ML IV SOLN
INTRAVENOUS | Status: DC | PRN
Start: 2023-12-05 — End: 2023-12-05
  Administered 2023-12-05: 4 ug via INTRAVENOUS

## 2023-12-05 MED ORDER — PROPOFOL 1000 MG/100ML IV EMUL
INTRAVENOUS | Status: AC
  Filled 2023-12-05: qty 100

## 2023-12-05 MED ORDER — ACETAMINOPHEN 500 MG PO TABS
ORAL_TABLET | ORAL | Status: AC
  Filled 2023-12-05: qty 2

## 2023-12-05 MED ORDER — LIDOCAINE HCL (CARDIAC) PF 100 MG/5ML IV SOSY
PREFILLED_SYRINGE | INTRAVENOUS | Status: DC | PRN
  Administered 2023-12-05: 100 mg via INTRAVENOUS

## 2023-12-05 MED ORDER — MIDAZOLAM HCL 2 MG/2ML IJ SOLN
INTRAMUSCULAR | Status: AC
Start: 2023-12-05 — End: 2023-12-05
  Filled 2023-12-05: qty 2

## 2023-12-05 MED ORDER — DEXMEDETOMIDINE HCL IN NACL 80 MCG/20ML IV SOLN
INTRAVENOUS | Status: AC
  Filled 2023-12-05: qty 20

## 2023-12-05 MED ORDER — PROPOFOL 10 MG/ML IV BOLUS
INTRAVENOUS | Status: DC | PRN
  Administered 2023-12-05: 30 mg via INTRAVENOUS
  Administered 2023-12-05: 150 ug/kg/min via INTRAVENOUS
  Administered 2023-12-05: 150 mg via INTRAVENOUS

## 2023-12-05 MED ORDER — HYDROCODONE-ACETAMINOPHEN 5-325 MG PO TABS: 1.0000 | ORAL_TABLET | Freq: Four times a day (QID) | ORAL | 0 refills | Status: DC | PRN

## 2023-12-05 MED ORDER — OXYCODONE HCL 5 MG/5ML PO SOLN: 5.0000 mg | Freq: Once | ORAL | Status: DC | PRN

## 2023-12-05 MED ORDER — LACTATED RINGERS IV SOLN: INTRAVENOUS | Status: DC | PRN

## 2023-12-05 MED ORDER — OXYCODONE HCL 5 MG PO TABS: 5.0000 mg | ORAL_TABLET | Freq: Once | ORAL | Status: DC | PRN

## 2023-12-05 MED ORDER — CEFAZOLIN SODIUM-DEXTROSE 2-4 GM/100ML-% IV SOLN
INTRAVENOUS | Status: AC
  Filled 2023-12-05: qty 100

## 2023-12-05 MED ORDER — ONDANSETRON HCL 4 MG/2ML IJ SOLN
INTRAMUSCULAR | Status: DC | PRN
  Administered 2023-12-05: 4 mg via INTRAVENOUS

## 2023-12-05 MED ORDER — CHLORHEXIDINE GLUCONATE 0.12 % MT SOLN
OROMUCOSAL | Status: AC
  Filled 2023-12-05: qty 15

## 2023-12-05 MED ORDER — MIDAZOLAM HCL 2 MG/2ML IJ SOLN
INTRAMUSCULAR | Status: DC | PRN
  Administered 2023-12-05: 2 mg via INTRAVENOUS

## 2023-12-05 MED ORDER — DROPERIDOL 2.5 MG/ML IJ SOLN: 0.6250 mg | Freq: Once | INTRAMUSCULAR | Status: DC | PRN

## 2023-12-05 MED ORDER — FENTANYL CITRATE (PF) 100 MCG/2ML IJ SOLN: 25.0000 ug | INTRAMUSCULAR | Status: DC | PRN

## 2023-12-05 MED ORDER — OXYBUTYNIN CHLORIDE 5 MG PO TABS: ORAL_TABLET | ORAL | 0 refills | Status: DC

## 2023-12-05 MED ORDER — DEXAMETHASONE SODIUM PHOSPHATE 10 MG/ML IJ SOLN
INTRAMUSCULAR | Status: DC | PRN
  Administered 2023-12-05: 10 mg via INTRAVENOUS

## 2023-12-05 MED ORDER — STERILE WATER FOR IRRIGATION IR SOLN
Status: DC | PRN
  Administered 2023-12-05: 3000 mL

## 2023-12-05 MED ORDER — CELECOXIB 200 MG PO CAPS
200.0000 mg | ORAL_CAPSULE | Freq: Once | ORAL | Status: AC
  Administered 2023-12-05: 200 mg via ORAL

## 2023-12-05 MED ORDER — FENTANYL CITRATE (PF) 100 MCG/2ML IJ SOLN
INTRAMUSCULAR | Status: AC
Start: 2023-12-05 — End: 2023-12-05
  Filled 2023-12-05: qty 2

## 2023-12-05 MED ORDER — CELECOXIB 200 MG PO CAPS
ORAL_CAPSULE | ORAL | Status: AC
  Filled 2023-12-05: qty 1

## 2023-12-05 MED ORDER — ACETAMINOPHEN 10 MG/ML IV SOLN: 1000.0000 mg | Freq: Once | INTRAVENOUS | Status: DC | PRN

## 2023-12-05 MED ORDER — DEXAMETHASONE SODIUM PHOSPHATE 10 MG/ML IJ SOLN
INTRAMUSCULAR | Status: AC
  Filled 2023-12-05: qty 1

## 2023-12-05 SURGICAL SUPPLY — 20 items
BAG DRAIN SIEMENS DORNER NS (MISCELLANEOUS) ×1 IMPLANT
BRUSH SCRUB EZ 1% IODOPHOR (MISCELLANEOUS) ×1 IMPLANT
BRUSH SCRUB EZ 4% CHG (MISCELLANEOUS) IMPLANT
CATH URET FLEX-TIP 2 LUMEN 10F (CATHETERS) ×1 IMPLANT
DRSG TELFA 3X4 N-ADH STERILE (GAUZE/BANDAGES/DRESSINGS) ×1 IMPLANT
ELECTRODE REM PT RTRN 9FT ADLT (ELECTROSURGICAL) ×1 IMPLANT
GLOVE BIOGEL PI IND STRL 7.5 (GLOVE) ×1 IMPLANT
GOWN STRL REUS W/ TWL LRG LVL3 (GOWN DISPOSABLE) ×2 IMPLANT
GOWN STRL REUS W/ TWL XL LVL3 (GOWN DISPOSABLE) ×1 IMPLANT
GUIDEWIRE STR DUAL SENSOR (WIRE) ×2 IMPLANT
KIT TURNOVER CYSTO (KITS) ×1 IMPLANT
NDL SAFETY ECLIPSE 18X1.5 (NEEDLE) ×1 IMPLANT
PACK CYSTO AR (MISCELLANEOUS) ×1 IMPLANT
SET CYSTO W/LG BORE CLAMP LF (SET/KITS/TRAYS/PACK) ×1 IMPLANT
SHEATH NAVIGATOR HD 12/14X36 (SHEATH) ×1 IMPLANT
SOL .9 NS 3000ML IRR UROMATIC (IV SOLUTION) ×1 IMPLANT
SURGILUBE 2OZ TUBE FLIPTOP (MISCELLANEOUS) ×1 IMPLANT
WATER STERILE IRR 1000ML POUR (IV SOLUTION) ×1 IMPLANT
WATER STERILE IRR 3000ML UROMA (IV SOLUTION) ×1 IMPLANT
WATER STERILE IRR 500ML POUR (IV SOLUTION) ×1 IMPLANT

## 2023-12-05 NOTE — Op Note (Signed)
   Preoperative diagnosis:  Pelvic pain Microhematuria  Postoperative diagnosis:  Same  Procedure: Cystoscopy with bladder hydrodistention  Surgeon: Glendia JAYSON Barba, MD  Anesthesia: General  Complications: None  Intraoperative findings:  Bladder mucosa without erythema, solid or papillary lesions.  UOs normal-appearing bilaterally Mucosal hyperemia with scattered glomerulations after hydrodistention  EBL: Minimal  Specimens: None  Indication: Pamela Cabrera is a 48 y.o. patient with pelvic pain and microhematuria.  Renal ultrasound and CT renal stone study showed no abnormalities.  She elected to proceed with cystoscopy/hydrodistention.  After reviewing the management options for treatment, he elected to proceed with the above surgical procedure(s). We have discussed the potential benefits and risks of the procedure, side effects of the proposed treatment, the likelihood of the patient achieving the goals of the procedure, and any potential problems that might occur during the procedure or recuperation. Informed consent has been obtained.  Description of procedure:  The patient was taken to the operating room and general anesthesia was induced.  The patient was placed in the dorsal lithotomy position, prepped and draped in the usual sterile fashion, and preoperative antibiotics were administered. A preoperative time-out was performed.   A 21 French cystoscope was lubricated and passed per urethra.  Panendoscopy is performed with findings as described above.  The bladder was filled to equilibrium at a height of 80 cm above the bladder.  Bladder was kept distended for 5 minutes and emptied with a volume of 1000 mL obtained.  The terminal part of the effluent was pink-tinged.  Repeat cystoscopy was performed with findings as described above.  The bladder was refilled in a similar fashion and kept distended for 5 minutes with 1200 mL obtained.  Repeat cystoscopic findings were  stable.  After anesthetic reversal she was transported to the PACU in stable condition.  Plan: Postop follow-up will be scheduled in ~1 month   Glendia JAYSON Barba, M.D.

## 2023-12-05 NOTE — Transfer of Care (Signed)
 Immediate Anesthesia Transfer of Care Note  Patient: Pamela Cabrera  Procedure(s) Performed: CYSTOSCOPY, WITH BLADDER HYDRODISTENSION  Patient Location: PACU  Anesthesia Type:General  Level of Consciousness: sedated  Airway & Oxygen Therapy: Patient Spontanous Breathing and Patient connected to face mask oxygen  Post-op Assessment: Report given to RN and Post -op Vital signs reviewed and stable  Post vital signs: Reviewed  Last Vitals:  Vitals Value Taken Time  BP 108/69 12/05/23 11:42  Temp    Pulse 57 12/05/23 11:44  Resp 11 12/05/23 11:44  SpO2 99 % 12/05/23 11:44  Vitals shown include unfiled device data.  Last Pain:  Vitals:   12/05/23 1010  TempSrc:   PainSc: 3          Complications: No notable events documented.

## 2023-12-05 NOTE — Interval H&P Note (Signed)
 History and Physical Interval Note:  12/05/2023 10:55 AM  Pamela Cabrera  has presented today for surgery, with the diagnosis of Bladder Pain.  The various methods of treatment have been discussed with the patient and family. After consideration of risks, benefits and other options for treatment, the patient has consented to  Procedure(s): CYSTOSCOPY, WITH BLADDER HYDRODISTENSION (N/A) CYSTOSCOPY, WITH BLADDER FULGURATION (N/A) CYSTOSCOPY, WITH BIOPSY (N/A) as a surgical intervention.  The patient's history has been reviewed, patient examined, no change in status, stable for surgery.  I have reviewed the patient's chart and labs.  Questions were answered to the patient's satisfaction.    CV:RRR Lungs:clear  Glendia JAYSON Barba

## 2023-12-05 NOTE — Anesthesia Procedure Notes (Signed)
 Procedure Name: LMA Insertion Date/Time: 12/05/2023 11:07 AM  Performed by: Claudene Cornet, CRNAPre-anesthesia Checklist: Patient identified, Emergency Drugs available, Suction available and Patient being monitored Patient Re-evaluated:Patient Re-evaluated prior to induction Oxygen Delivery Method: Circle system utilized Preoxygenation: Pre-oxygenation with 100% oxygen Induction Type: IV induction LMA: LMA inserted LMA Size: 4.0 Number of attempts: 1 Placement Confirmation: positive ETCO2 and breath sounds checked- equal and bilateral Tube secured with: Tape Dental Injury: Teeth and Oropharynx as per pre-operative assessment

## 2023-12-05 NOTE — Anesthesia Postprocedure Evaluation (Signed)
 Anesthesia Post Note  Patient: Pamela Cabrera  Procedure(s) Performed: CYSTOSCOPY, WITH BLADDER HYDRODISTENSION  Patient location during evaluation: PACU Anesthesia Type: General Level of consciousness: awake and alert Pain management: pain level controlled Vital Signs Assessment: post-procedure vital signs reviewed and stable Respiratory status: spontaneous breathing, nonlabored ventilation and respiratory function stable Cardiovascular status: blood pressure returned to baseline and stable Postop Assessment: no apparent nausea or vomiting Anesthetic complications: no   No notable events documented.   Last Vitals:  Vitals:   12/05/23 1230 12/05/23 1241  BP: 119/72 119/86  Pulse: 61 63  Resp: 14 16  Temp: (!) 36.2 C (!) 36.1 C  SpO2: 100% 100%    Last Pain:  Vitals:   12/05/23 1241  TempSrc: Temporal  PainSc: 0-No pain                 Camellia Merilee Louder

## 2023-12-05 NOTE — Anesthesia Preprocedure Evaluation (Addendum)
 Anesthesia Evaluation  Patient identified by MRN, date of birth, ID band Patient awake    Reviewed: Allergy & Precautions, H&P , NPO status , Patient's Chart, lab work & pertinent test results  History of Anesthesia Complications (+) PONV and history of anesthetic complications  Airway Mallampati: II  TM Distance: >3 FB Neck ROM: full    Dental  (+)    Pulmonary former smoker   Pulmonary exam normal        Cardiovascular negative cardio ROS Normal cardiovascular exam     Neuro/Psych negative neurological ROS  negative psych ROS   GI/Hepatic Neg liver ROS,GERD  ,,  Endo/Other  Hypothyroidism    Renal/GU      Musculoskeletal   Abdominal Normal abdominal exam  (+)   Peds  Hematology  (+) Blood dyscrasia, anemia   Anesthesia Other Findings Past Medical History: No date: Anemia No date: Anxiety No date: GERD (gastroesophageal reflux disease) No date: Hashimoto's disease No date: History of migraine headaches No date: Lumbar pain No date: Perioral dermatitis No date: PONV (postoperative nausea and vomiting) No date: UTI (urinary tract infection)  Past Surgical History: No date: ARTHROSCOPIC REPAIR ACL; Right No date: CHOLECYSTECTOMY No date: TUBAL LIGATION     Reproductive/Obstetrics negative OB ROS                             Anesthesia Physical Anesthesia Plan  ASA: 2  Anesthesia Plan: General   Post-op Pain Management: Tylenol PO (pre-op)* and Celebrex PO (pre-op)*   Induction: Intravenous  PONV Risk Score and Plan: 4 or greater and Dexamethasone, Ondansetron, Midazolam and TIVA  Airway Management Planned: LMA  Additional Equipment:   Intra-op Plan:   Post-operative Plan: Extubation in OR  Informed Consent: I have reviewed the patients History and Physical, chart, labs and discussed the procedure including the risks, benefits and alternatives for the proposed  anesthesia with the patient or authorized representative who has indicated his/her understanding and acceptance.     Dental Advisory Given  Plan Discussed with: Anesthesiologist, CRNA and Surgeon  Anesthesia Plan Comments:         Anesthesia Quick Evaluation

## 2023-12-06 ENCOUNTER — Encounter: Payer: Self-pay | Admitting: Urology

## 2024-01-05 ENCOUNTER — Ambulatory Visit: Admitting: Physician Assistant

## 2024-01-08 ENCOUNTER — Inpatient Hospital Stay (HOSPITAL_BASED_OUTPATIENT_CLINIC_OR_DEPARTMENT_OTHER): Admitting: Internal Medicine

## 2024-01-08 ENCOUNTER — Inpatient Hospital Stay: Attending: Internal Medicine

## 2024-01-08 ENCOUNTER — Encounter: Payer: Self-pay | Admitting: Internal Medicine

## 2024-01-08 ENCOUNTER — Inpatient Hospital Stay

## 2024-01-08 VITALS — BP 116/86 | HR 82 | Temp 97.6°F | Resp 20 | Wt 151.2 lb

## 2024-01-08 DIAGNOSIS — D509 Iron deficiency anemia, unspecified: Secondary | ICD-10-CM | POA: Insufficient documentation

## 2024-01-08 DIAGNOSIS — D649 Anemia, unspecified: Secondary | ICD-10-CM | POA: Diagnosis not present

## 2024-01-08 LAB — CBC WITH DIFFERENTIAL (CANCER CENTER ONLY)
Abs Immature Granulocytes: 0.02 K/uL (ref 0.00–0.07)
Basophils Absolute: 0 K/uL (ref 0.0–0.1)
Basophils Relative: 1 %
Eosinophils Absolute: 0.2 K/uL (ref 0.0–0.5)
Eosinophils Relative: 3 %
HCT: 35.1 % — ABNORMAL LOW (ref 36.0–46.0)
Hemoglobin: 12.2 g/dL (ref 12.0–15.0)
Immature Granulocytes: 0 %
Lymphocytes Relative: 22 %
Lymphs Abs: 1.3 K/uL (ref 0.7–4.0)
MCH: 30.2 pg (ref 26.0–34.0)
MCHC: 34.8 g/dL (ref 30.0–36.0)
MCV: 86.9 fL (ref 80.0–100.0)
Monocytes Absolute: 0.5 K/uL (ref 0.1–1.0)
Monocytes Relative: 8 %
Neutro Abs: 3.9 K/uL (ref 1.7–7.7)
Neutrophils Relative %: 66 %
Platelet Count: 237 K/uL (ref 150–400)
RBC: 4.04 MIL/uL (ref 3.87–5.11)
RDW: 14.3 % (ref 11.5–15.5)
WBC Count: 5.8 K/uL (ref 4.0–10.5)
nRBC: 0 % (ref 0.0–0.2)

## 2024-01-08 LAB — BASIC METABOLIC PANEL - CANCER CENTER ONLY
Anion gap: 6 (ref 5–15)
BUN: 11 mg/dL (ref 6–20)
CO2: 23 mmol/L (ref 22–32)
Calcium: 9 mg/dL (ref 8.9–10.3)
Chloride: 105 mmol/L (ref 98–111)
Creatinine: 0.59 mg/dL (ref 0.44–1.00)
GFR, Estimated: >60 mL/min (ref >60)
Glucose, Bld: 94 mg/dL (ref 70–99)
Potassium: 3.6 mmol/L (ref 3.5–5.1)
Sodium: 134 mmol/L — ABNORMAL LOW (ref 135–145)

## 2024-01-08 LAB — LACTATE DEHYDROGENASE: LDH: 96 U/L — ABNORMAL LOW (ref 98–192)

## 2024-01-08 NOTE — Assessment & Plan Note (Addendum)
#   MAY 2025- [PCP] Anemia- Hb 10-; ferritin 5-symptomatic.  Likely due to iron  deficiency - from etiology GI blood loss/menorrhagia.   S/p venofer - Hb 12.5-patient currently on Niferex 2 pills every oither day-given prior history of GI discomfort-I Recommend gentle iron  [iron  biglycinate; 28 mg ] 1 pill a day.    #Etiology of iron  deficiency: Urinary/menstrual most likely blood loss.  UA- positive [awaiting evaluation with Dr. Archer- s/p cystoscopy- negative. ]-intermittent blood in stools likely hemorrhoidal -Cologuard 6 months ago negative.  Consider screening colonoscopy.  Defer to PCP for further recommendations.  #Since patient is clinically stable I think is reasonable for the patient to follow-up with PCP/can follow-up with us  as needed.  Patient comfortable with the plan; to call us  if any questions or concerns in the interim.  Patient states that she will have her blood checked by PCP-and call us  if her iron  levels are running low and she needs iron  infusions.  Vagal response-needs supine; NO urine pregnancy test [tubal ligation]  # DISPOSITION: # NO  Venofer -  # follow up as needed- Dr.B

## 2024-01-08 NOTE — Progress Notes (Signed)
 Patient has no concerns

## 2024-01-08 NOTE — Progress Notes (Signed)
 [facility] CONSULT NOTE  Patient Care Team: Sadie Manna, MD as PCP - General (Internal Medicine) Rennie Cindy SAUNDERS, MD as Consulting Physician (Oncology)  CHIEF COMPLAINTS/PURPOSE OF CONSULTATION: ANEMIA   HEMATOLOGY HISTORY  # ANEMIA[Hb; MCV-platelets- WBC; Iron  sat; ferritin;  GFR- CT/US - ;   5 Low    --    HISTORY OF PRESENTING ILLNESS: Patient ambulating-independently. Alone   Pamela Cabrera 48 y.o.  female pleasant patient has iron  deficiency anemia-secondary to menorrhagia is here for follow-up.  In the interim underwent cystoscopy for hematuria-noticeably bleeding noted.  Patient continues to have intermittent blood in stools-hemorrhoidal however never had a colonoscopy.  Patient s/p iron  infusions noted to have significant improvement of energy levels. She tolerated the iron  infusions-well except for mild nausea post infusions.  She is also on keto diet-lost weight.  Feels overall good.    Review of Systems  Constitutional:  Positive for weight loss. Negative for chills, diaphoresis and fever.  HENT:  Negative for nosebleeds and sore throat.   Eyes:  Negative for double vision.  Respiratory:  Negative for cough, hemoptysis, sputum production, shortness of breath and wheezing.   Cardiovascular:  Negative for chest pain, palpitations, orthopnea and leg swelling.  Gastrointestinal:  Negative for abdominal pain, blood in stool, constipation, diarrhea, heartburn, melena, nausea and vomiting.  Genitourinary:  Negative for dysuria, frequency and urgency.  Musculoskeletal:  Negative for back pain and joint pain.  Skin: Negative.  Negative for itching and rash.  Neurological:  Negative for tingling, focal weakness and weakness.  Endo/Heme/Allergies:  Does not bruise/bleed easily.  Psychiatric/Behavioral:  Negative for depression. The patient is not nervous/anxious and does not have insomnia.     MEDICAL HISTORY:  Past Medical History:   Diagnosis Date   Anemia    Anxiety    GERD (gastroesophageal reflux disease)    Hashimoto's disease    History of migraine headaches    Lumbar pain    Perioral dermatitis    PONV (postoperative nausea and vomiting)    UTI (urinary tract infection)     SURGICAL HISTORY: Past Surgical History:  Procedure Laterality Date   ARTHROSCOPIC REPAIR ACL Right    CHOLECYSTECTOMY     CYSTO WITH HYDRODISTENSION N/A 12/05/2023   Procedure: CYSTOSCOPY, WITH BLADDER HYDRODISTENSION;  Surgeon: Twylla Glendia BROCKS, MD;  Location: [facility] ORS;  Service: Urology;  Laterality: N/A;   TUBAL LIGATION      SOCIAL HISTORY: Social History   Socioeconomic History   Marital status: Married    Spouse name: Not on file   Number of children: Not on file   Years of education: Not on file   Highest education level: Not on file  Occupational History   Not on file  Tobacco Use   Smoking status: Former    Types: Cigarettes   Smokeless tobacco: Never  Vaping Use   Vaping status: Some Days   Substances: Nicotine, Flavoring  Substance and Sexual Activity   Alcohol  use: Yes    Alcohol /week: 1.0 standard drink of alcohol     Types: 1 Glasses of wine per week    Comment: maybe like one glass of wine every couple of weeks   Drug use: Never   Sexual activity: Yes    Birth control/protection: Surgical    Comment: Tubal ligation/Vasectomy  Other Topics Concern   Not on file  Social History Narrative   Not on file   Social Drivers of Health   Financial Resource Strain: Low Risk  (  11/01/2023)   Received from [facility]   Overall Financial Resource Strain (CARDIA)    Difficulty of Paying Living Expenses: Not hard at all  Food Insecurity: No Food Insecurity (11/08/2023)   Hunger Vital Sign    Worried About Running Out of Food in the Last Year: Never true    Ran Out of Food in the Last Year: Never true  Transportation Needs: No Transportation Needs (11/08/2023)   PRAPARE - Therapist, art (Medical): No    Lack of Transportation (Non-Medical): No  Physical Activity: Not on file  Stress: Not on file  Social Connections: Not on file  Intimate Partner Violence: Not At Risk (11/08/2023)   Humiliation, Afraid, Rape, and Kick questionnaire    Fear of Current or Ex-Partner: No    Emotionally Abused: No    Physically Abused: No    Sexually Abused: No    FAMILY HISTORY: Family History  Problem Relation Age of Onset   Anemia Mother    Anemia Maternal Grandmother    Breast cancer Neg Hx    Ovarian cancer Neg Hx     ALLERGIES:  is allergic to sulfa antibiotics.  MEDICATIONS:  Current Outpatient Medications  Medication Sig Dispense Refill   ascorbic acid (VITAMIN C) 500 MG tablet Take 500 mg by mouth daily.     Cholecalciferol (VITAMIN D) 50 MCG (2000 UT) tablet Take 2,000 Units by mouth daily.     cyanocobalamin  (VITAMIN B12) 1000 MCG tablet Take 1,000 mcg by mouth daily.     folic acid  (FOLVITE ) 1 MG tablet Take 1 mg by mouth daily.     iron  polysaccharides (NIFEREX) 150 MG capsule Take 150 mg by mouth daily.     levothyroxine  (SYNTHROID ) 125 MCG tablet Take 125 mcg by mouth daily before breakfast.     Multiple Vitamins-Minerals (MULTIVITAMIN WITH MINERALS) tablet Take 1 tablet by mouth daily.     HYDROcodone -acetaminophen  (NORCO/VICODIN) 5-325 MG tablet Take 1 tablet by mouth every 6 (six) hours as needed for moderate pain (pain score 4-6). (Patient not taking: Reported on 01/08/2024) 10 tablet 0   oxybutynin  (DITROPAN ) 5 MG tablet 1 tab tid prn frequency,urgency, bladder spasm (Patient not taking: Reported on 01/08/2024) 30 tablet 0   No current facility-administered medications for this visit.     PHYSICAL EXAMINATION:   Vitals:   01/08/24 1440  BP: 116/86  Pulse: 82  Resp: 20  Temp: 97.6 F (36.4 C)  SpO2: 100%   Filed Weights   01/08/24 1440  Weight: 151 lb 3.2 oz (68.6 kg)    Physical Exam Vitals and nursing note reviewed.  HENT:      Head: Normocephalic and atraumatic.     Mouth/Throat:     Pharynx: Oropharynx is clear.  Eyes:     Extraocular Movements: Extraocular movements intact.     Pupils: Pupils are equal, round, and reactive to light.  Cardiovascular:     Rate and Rhythm: Normal rate and regular rhythm.  Pulmonary:     Effort: Pulmonary effort is normal.     Breath sounds: Normal breath sounds.  Abdominal:     Palpations: Abdomen is soft.  Musculoskeletal:        General: Normal range of motion.     Cervical back: Normal range of motion.  Skin:    General: Skin is warm.  Neurological:     General: No focal deficit present.     Mental Status: She is alert and  oriented to person, place, and time.  Psychiatric:        Behavior: Behavior normal.        Judgment: Judgment normal.      LABORATORY DATA:  I have reviewed the data as listed Lab Results  Component Value Date   WBC 5.8 01/08/2024   HGB 12.2 01/08/2024   HCT 35.1 (L) 01/08/2024   MCV 86.9 01/08/2024   PLT 237 01/08/2024   Recent Labs    01/08/24 1435  NA 134*  K 3.6  CL 105  CO2 23  GLUCOSE 94  BUN 11  CREATININE 0.59  CALCIUM 9.0  GFRNONAA >60     No results found.  ASSESSMENT & PLAN:   Symptomatic anemia # MAY 2025- [PCP] Anemia- Hb 10-; ferritin 5-symptomatic.  Likely due to iron  deficiency - from etiology GI blood loss/menorrhagia.   S/p venofer - Hb 12.5-patient currently on Niferex 2 pills every oither day-given prior history of GI discomfort-I Recommend gentle iron  [iron  biglycinate; 28 mg ] 1 pill a day.    #Etiology of iron  deficiency: Urinary/menstrual most likely blood loss.  UA- positive [awaiting evaluation with Dr. Archer- s/p cystoscopy- negative. ]-intermittent blood in stools likely hemorrhoidal -Cologuard 6 months ago negative.  Consider screening colonoscopy.  Defer to PCP for further recommendations.  #Since patient is clinically stable I think is reasonable for the patient to follow-up with  PCP/can follow-up with us  as needed.  Patient comfortable with the plan; to call us  if any questions or concerns in the interim.  Patient states that she will have her blood checked by PCP-and call us  if her iron  levels are running low and she needs iron  infusions.  Vagal response-needs supine; NO urine pregnancy test [tubal ligation]  # DISPOSITION: # NO  Venofer -  # follow up as needed- Dr.B     Cindy JONELLE Joe, MD 01/08/2024 3:14 PM

## 2024-01-24 ENCOUNTER — Encounter: Payer: Self-pay | Admitting: Urology

## 2024-01-24 ENCOUNTER — Ambulatory Visit (INDEPENDENT_AMBULATORY_CARE_PROVIDER_SITE_OTHER): Admitting: Urology

## 2024-01-24 VITALS — BP 124/84 | HR 92 | Ht 66.0 in | Wt 150.0 lb

## 2024-01-24 DIAGNOSIS — N3010 Interstitial cystitis (chronic) without hematuria: Secondary | ICD-10-CM | POA: Diagnosis not present

## 2024-01-24 MED ORDER — GEMTESA 75 MG PO TABS: 75.0000 mg | ORAL_TABLET | Freq: Every day | ORAL | Status: DC

## 2024-01-24 NOTE — Progress Notes (Signed)
 01/24/2024 5:58 PM   Pamela Cabrera 03-10-1976 [id number]  Referring provider: Sadie Manna, MD [address] [facility] [place],  [place] [address]  Chief Complaint  Patient presents with   Other    HPI: Pamela Cabrera is a 48 y.o. female presents for postop follow-up.  s/p cystoscopy with hydrodistention 12/05/2023 for pelvic pain, storage elated voiding symptoms and microhematuria Intraoperative findings showed no predilation bladder mucosal abnormalities.  She was noted to have mucosal hyperemia with scattered glomerulations after hydrodistention.  Bladder capacity was 1000 mL on initial distention and 1200 mL on repeat distention She has noted significant improvement in her symptoms post procedure.  Pain has resolved and still with mild urinary frequency and urgency   PMH: Past Medical History:  Diagnosis Date   Anemia    Anxiety    GERD (gastroesophageal reflux disease)    Hashimoto's disease    History of migraine headaches    Lumbar pain    Perioral dermatitis    PONV (postoperative nausea and vomiting)    UTI (urinary tract infection)     Surgical History: Past Surgical History:  Procedure Laterality Date   ARTHROSCOPIC REPAIR ACL Right    CHOLECYSTECTOMY     CYSTO WITH HYDRODISTENSION N/A 12/05/2023   Procedure: CYSTOSCOPY, WITH BLADDER HYDRODISTENSION;  Surgeon: Twylla Glendia BROCKS, MD;  Location: [facility] ORS;  Service: Urology;  Laterality: N/A;   TUBAL LIGATION      Home Medications:  Allergies as of 01/24/2024       Reactions   Sulfa Antibiotics Anaphylaxis, Hives, Itching        Medication List        Accurate as of January 24, 2024  5:58 PM. If you have any questions, ask your nurse or doctor.          STOP taking these medications    HYDROcodone -acetaminophen  5-325 MG tablet Commonly known as: NORCO/VICODIN   oxybutynin  5 MG tablet Commonly known as: DITROPAN        TAKE these medications    ascorbic  acid 500 MG tablet Commonly known as: VITAMIN C Take 500 mg by mouth daily.   cyanocobalamin  1000 MCG tablet Commonly known as: VITAMIN B12 Take 1,000 mcg by mouth daily.   folic acid  1 MG tablet Commonly known as: FOLVITE  Take 1 mg by mouth daily.   Gemtesa  75 MG Tabs Generic drug: Vibegron  Take 1 tablet (75 mg total) by mouth daily.   iron  polysaccharides 150 MG capsule Commonly known as: NIFEREX Take 150 mg by mouth daily.   levothyroxine  125 MCG tablet Commonly known as: SYNTHROID  Take 125 mcg by mouth daily before breakfast.   multivitamin with minerals tablet Take 1 tablet by mouth daily.   Vitamin D 50 MCG (2000 UT) tablet Take 2,000 Units by mouth daily.        Allergies:  Allergies  Allergen Reactions   Sulfa Antibiotics Anaphylaxis, Hives and Itching    Family History: Family History  Problem Relation Age of Onset   Anemia Mother    Anemia Maternal Grandmother    Breast cancer Neg Hx    Ovarian cancer Neg Hx     Social History:  reports that she has quit smoking. Her smoking use included cigarettes. She has never used smokeless tobacco. She reports current alcohol  use of about 1.0 standard drink of alcohol  per week. She reports that she does not use drugs.   Physical Exam: BP 124/84   Pulse 92  Ht 5' 6 (1.676 m)   Wt 150 lb (68 kg)   BMI 24.21 kg/m   Constitutional:  Alert, No acute distress. HEENT: [place] AT Respiratory: Normal respiratory effort, no increased work of breathing.  Psychiatric: Normal mood and affect.   Assessment & Plan:    1.  Painful bladder/IC Significant improvement post hydrodistention with resolution of pain and decreased storage elated voiding symptoms We discussed potential therapies of amitriptyline, antihistamines and OAB med She was interested in a trial of an OAB medication and given samples of Gemtesa .  She will call back regarding efficacy Given IC diet information Follow-up 6 months and instructed call  earlier for recurrent pain or worsening voiding symptoms   Glendia JAYSON Barba, MD  [facility] [address] [place], [place] [address] [phone]

## 2024-02-29 DIAGNOSIS — E039 Hypothyroidism, unspecified: Secondary | ICD-10-CM | POA: Diagnosis not present

## 2024-03-05 ENCOUNTER — Other Ambulatory Visit: Payer: Self-pay | Admitting: *Deleted

## 2024-03-05 ENCOUNTER — Encounter: Payer: Self-pay | Admitting: Urology

## 2024-03-05 MED ORDER — GEMTESA 75 MG PO TABS: 75.0000 mg | ORAL_TABLET | Freq: Every day | ORAL | 3 refills | Status: AC

## 2024-03-07 DIAGNOSIS — E039 Hypothyroidism, unspecified: Secondary | ICD-10-CM | POA: Diagnosis not present

## 2024-05-27 DIAGNOSIS — R8290 Unspecified abnormal findings in urine: Secondary | ICD-10-CM | POA: Diagnosis not present

## 2024-05-27 DIAGNOSIS — Z0000 Encounter for general adult medical examination without abnormal findings: Secondary | ICD-10-CM | POA: Diagnosis not present

## 2024-05-27 DIAGNOSIS — R635 Abnormal weight gain: Secondary | ICD-10-CM | POA: Diagnosis not present

## 2024-05-27 DIAGNOSIS — Z6826 Body mass index (BMI) 26.0-26.9, adult: Secondary | ICD-10-CM | POA: Diagnosis not present

## 2024-05-27 DIAGNOSIS — R319 Hematuria, unspecified: Secondary | ICD-10-CM | POA: Diagnosis not present

## 2024-05-27 DIAGNOSIS — D509 Iron deficiency anemia, unspecified: Secondary | ICD-10-CM | POA: Diagnosis not present

## 2024-05-27 DIAGNOSIS — Z1331 Encounter for screening for depression: Secondary | ICD-10-CM | POA: Diagnosis not present

## 2024-05-27 DIAGNOSIS — E039 Hypothyroidism, unspecified: Secondary | ICD-10-CM | POA: Diagnosis not present

## 2024-06-04 ENCOUNTER — Other Ambulatory Visit: Payer: Self-pay | Admitting: Internal Medicine

## 2024-06-04 DIAGNOSIS — Z1231 Encounter for screening mammogram for malignant neoplasm of breast: Secondary | ICD-10-CM

## 2024-06-14 ENCOUNTER — Ambulatory Visit
Admission: RE | Admit: 2024-06-14 | Discharge: 2024-06-14 | Disposition: A | Source: Ambulatory Visit | Attending: Internal Medicine | Admitting: Internal Medicine

## 2024-06-14 DIAGNOSIS — Z1231 Encounter for screening mammogram for malignant neoplasm of breast: Secondary | ICD-10-CM | POA: Diagnosis present

## 2024-07-26 ENCOUNTER — Ambulatory Visit: Admitting: Urology

## 2024-07-30 ENCOUNTER — Ambulatory Visit: Admitting: Urology
